# Patient Record
Sex: Female | Born: 1985 | Race: Asian | Hispanic: No | Marital: Single | Smoking: Current every day smoker
Health system: Southern US, Community
[De-identification: ages and names within clinical notes are randomized; demographics above are authoritative.]

## PROBLEM LIST (undated history)

## (undated) DIAGNOSIS — Z789 Other specified health status: Secondary | ICD-10-CM

## (undated) DIAGNOSIS — I10 Essential (primary) hypertension: Secondary | ICD-10-CM

## (undated) HISTORY — DX: Essential (primary) hypertension: I10

## (undated) HISTORY — DX: Other specified health status: Z78.9

## (undated) HISTORY — PX: DILATION AND CURETTAGE OF UTERUS: SHX78

---
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
## 2005-02-06 ENCOUNTER — Observation Stay: Payer: Self-pay | Admitting: Obstetrics and Gynecology

## 2012-01-04 DIAGNOSIS — F40298 Other specified phobia: Secondary | ICD-10-CM | POA: Insufficient documentation

## 2013-05-23 ENCOUNTER — Emergency Department: Payer: Self-pay | Admitting: Emergency Medicine

## 2013-05-23 LAB — URINALYSIS, COMPLETE
Bilirubin,UR: NEGATIVE
Glucose,UR: NEGATIVE mg/dL (ref 0–75)
Leukocyte Esterase: NEGATIVE
Protein: NEGATIVE
RBC,UR: NONE SEEN /HPF (ref 0–5)
Squamous Epithelial: 2

## 2013-05-23 LAB — CBC
MCV: 84 fL (ref 80–100)
RDW: 13.3 % (ref 11.5–14.5)
WBC: 13.1 10*3/uL — ABNORMAL HIGH (ref 3.6–11.0)

## 2013-06-21 ENCOUNTER — Ambulatory Visit: Payer: Self-pay | Admitting: Obstetrics and Gynecology

## 2013-06-21 LAB — CBC
HGB: 13 g/dL (ref 12.0–16.0)
MCHC: 33.3 g/dL (ref 32.0–36.0)
MCV: 85 fL (ref 80–100)
Platelet: 306 10*3/uL (ref 150–440)
RDW: 13.8 % (ref 11.5–14.5)
WBC: 13.1 10*3/uL — ABNORMAL HIGH (ref 3.6–11.0)

## 2014-11-21 NOTE — Op Note (Signed)
PATIENT NAMLenore Manner:  Brady, Sherri Brady MR#:  [id number] DATE OF BIRTH:  February 28, 1986  DATE OF PROCEDURE:  06/21/2013  PREOPERATIVE DIAGNOSIS: Missed spontaneous abortion.   POSTOPERATIVE DIAGNOSIS: Missed spontaneous abortion.   PROCEDURE: Suction dilation and curettage.   ANESTHESIA: General, LMA.   SURGEON: Thomasene MohairStephen Caldwell Kronenberger, MD   ESTIMATED BLOOD LOSS: 300 mL.  OPERATIVE FLUIDS: 1100 mL crystalloid.   COMPLICATIONS: None.   FINDINGS: 1.  A 9-week size uterus.  2.  Apparent products of conception.   SPECIMENS: Endometrial curettings.   CONDITION AT THE END OF PROCEDURE: Stable.   PROCEDURE IN DETAIL: The patient was taken to the operating room where general LMA anesthesia was administered and found to be adequate. She was placed in a dorsal supine high lithotomy position in the candy cane stirrups and prepped and draped in the usual sterile fashion. After a timeout was called, a red rubber catheter was introduced into the bladder for straight catheterization for return of about 100 mL of clear urine. A sterile speculum was placed in the vagina, and a single-tooth tenaculum was used to grasp the anterior lip of the cervix. The uterus was then sounded to a depth of approximately 9 cm. The uterus was then dilated gently in a serial fashion using Hegar dilators to approximately 9 mm. After testing the vacuum and verifying that it would achieve adequate suction, an 8 mm rigid suction curette was then used for 3 passes to remove the contents of the uterus. A regular curette was used for 2 passes to remove any remaining products, and a single final pass of the rigid suction curette was used to make sure there were no products of conception remaining. The patient was hemostatic at this point from the uterus. The single-tooth tenaculum was removed from the anterior lip of the cervix with hemostasis noted, and the sterile speculum was then removed from the vagina. There were no instruments left inside the  vagina at the end of the case.   The patient tolerated the procedure well. Sponge, lap, and needle counts were correct x 2. She received 100 mg of doxycycline IV on call to the OR. For VTE prophylaxis, she was wearing pneumatic compression stockings that were on and operating throughout the entire procedure. She was awakened in the operating room and transferred to the recovery area in stable condition.    ____________________________ Conard NovakStephen D. Lyah Millirons, MD sdj:jcm D: 06/21/2013 16:44:58 ET T: 06/21/2013 17:06:14 ET JOB#: [id number]  cc: Conard NovakStephen D. Saheed Carrington, MD, <Dictator> Conard NovakSTEPHEN D Presten Joost MD ELECTRONICALLY SIGNED 06/25/2013 18:37

## 2015-04-21 ENCOUNTER — Emergency Department
Admission: EM | Admit: 2015-04-21 | Discharge: 2015-04-21 | Disposition: A | Discharge status: Home / Self Care | Payer: Self-pay | Attending: Emergency Medicine | Admitting: Emergency Medicine

## 2015-04-21 ENCOUNTER — Emergency Department: Payer: Self-pay

## 2015-04-21 ENCOUNTER — Encounter: Payer: Self-pay | Admitting: Emergency Medicine

## 2015-04-21 DIAGNOSIS — O200 Threatened abortion: Secondary | ICD-10-CM | POA: Insufficient documentation

## 2015-04-21 DIAGNOSIS — F17210 Nicotine dependence, cigarettes, uncomplicated: Secondary | ICD-10-CM | POA: Insufficient documentation

## 2015-04-21 DIAGNOSIS — O99331 Smoking (tobacco) complicating pregnancy, first trimester: Secondary | ICD-10-CM | POA: Insufficient documentation

## 2015-04-21 DIAGNOSIS — R109 Unspecified abdominal pain: Secondary | ICD-10-CM

## 2015-04-21 DIAGNOSIS — Z79899 Other long term (current) drug therapy: Secondary | ICD-10-CM | POA: Insufficient documentation

## 2015-04-21 DIAGNOSIS — Z3A01 Less than 8 weeks gestation of pregnancy: Secondary | ICD-10-CM | POA: Insufficient documentation

## 2015-04-21 DIAGNOSIS — O26899 Other specified pregnancy related conditions, unspecified trimester: Secondary | ICD-10-CM

## 2015-04-21 LAB — CBC WITH DIFFERENTIAL/PLATELET
BASOS ABS: 0.1 10*3/uL (ref 0–0.1)
Basophils Relative: 1 %
EOS ABS: 0.3 10*3/uL (ref 0–0.7)
Eosinophils Relative: 3 %
HCT: 39.8 % (ref 35.0–47.0)
HEMOGLOBIN: 13.1 g/dL (ref 12.0–16.0)
LYMPHS ABS: 2.4 10*3/uL (ref 1.0–3.6)
LYMPHS PCT: 26 %
MCH: 28 pg (ref 26.0–34.0)
MCHC: 32.9 g/dL (ref 32.0–36.0)
MCV: 85 fL (ref 80.0–100.0)
Monocytes Absolute: 0.6 10*3/uL (ref 0.2–0.9)
Monocytes Relative: 7 %
NEUTROS PCT: 63 %
Neutro Abs: 5.8 10*3/uL (ref 1.4–6.5)
PLATELETS: 306 10*3/uL (ref 150–440)
RBC: 4.69 MIL/uL (ref 3.80–5.20)
RDW: 13.4 % (ref 11.5–14.5)
WBC: 9.3 10*3/uL (ref 3.6–11.0)

## 2015-04-21 LAB — URINALYSIS COMPLETE WITH MICROSCOPIC (ARMC ONLY)
Bilirubin Urine: NEGATIVE
GLUCOSE, UA: NEGATIVE mg/dL
Ketones, ur: NEGATIVE mg/dL
Leukocytes, UA: NEGATIVE
Nitrite: NEGATIVE
PROTEIN: NEGATIVE mg/dL
Specific Gravity, Urine: 1.009 (ref 1.005–1.030)
pH: 6 (ref 5.0–8.0)

## 2015-04-21 LAB — WET PREP, GENITAL
CLUE CELLS WET PREP: NONE SEEN
TRICH WET PREP: NONE SEEN
Yeast Wet Prep HPF POC: NONE SEEN

## 2015-04-21 LAB — POCT PREGNANCY, URINE: Preg Test, Ur: POSITIVE — AB

## 2015-04-21 LAB — CHLAMYDIA/NGC RT PCR (ARMC ONLY)
Chlamydia Tr: NOT DETECTED
N GONORRHOEAE: NOT DETECTED

## 2015-04-21 LAB — ABO/RH: ABO/RH(D): O POS

## 2015-04-21 LAB — HCG, QUANTITATIVE, PREGNANCY: hCG, Beta Chain, Quant, S: 2904 m[IU]/mL — ABNORMAL HIGH (ref <5)

## 2015-04-21 NOTE — ED Provider Notes (Addendum)
[facility] Emergency Department Provider Note  ____________________________________________  Time seen: Approximately 9 AM  I have reviewed the triage vital signs and the nursing notes.   HISTORY  Chief Complaint Abdominal Pain and Vaginal Bleeding    HPI Sherri Brady is a 29 y.o. female with a history of miscarriage and chlamydia who is presenting today with 2 weeks of abdominal cramping and one day of vaginal spotting. She denies any pain at this time. Says that the cramping is in the lower abdominal area and radiates to her back. No pain with urination. Is taking prenatal vitamins but has not had prenatal care yet this pregnancy. Is seen previously at [facility] side OB/GYN.   History reviewed. No pertinent past medical history.  There are no active problems to display for this patient.   History reviewed. No pertinent past surgical history.  Current Outpatient Rx  Name  Route  Sig  Dispense  Refill  . Prenatal Vit-Fe Fumarate-FA (PRENATAL MULTIVITAMIN) TABS tablet   Oral   Take 1 tablet by mouth every evening.           Allergies Review of patient's allergies indicates no known allergies.  History reviewed. No pertinent family history.  Social History Social History  Substance Use Topics  . Smoking status: Current Every Day Smoker  . Smokeless tobacco: None  . Alcohol Use: No    Review of Systems Constitutional: No fever/chills Eyes: No visual changes. ENT: No sore throat. Cardiovascular: Denies chest pain. Respiratory: Denies shortness of breath. Gastrointestinal:  No nausea, no vomiting.  No diarrhea.  No constipation. Genitourinary: Negative for dysuria. Musculoskeletal: As above Skin: Negative for rash. Neurological: Negative for headaches, focal weakness or numbness.  10-point ROS otherwise negative.  ____________________________________________   PHYSICAL EXAM:  VITAL SIGNS: ED Triage Vitals  Enc Vitals Group      BP 04/21/15 0754 122/60 mmHg     Pulse Rate 04/21/15 0754 68     Resp 04/21/15 0754 20     Temp 04/21/15 0754 97.6 F (36.4 C)     Temp Source 04/21/15 0754 Oral     SpO2 04/21/15 0754 96 %     Weight 04/21/15 0754 175 lb (79.379 kg)     Height 04/21/15 0754  (1.549 m)     Head Cir --      Peak Flow --      Pain Score 04/21/15 0755 5     Pain Loc --      Pain Edu? --      Excl. in GC? --     Constitutional: Alert and oriented. Well appearing and in no acute distress. Eyes: Conjunctivae are normal. PERRL. EOMI. Head: Atraumatic. Nose: No congestion/rhinnorhea. Mouth/Throat: Mucous membranes are moist.  Oropharynx non-erythematous. Neck: No stridor.   Cardiovascular: Normal rate, regular rhythm. Grossly normal heart sounds.  Good peripheral circulation. Respiratory: Normal respiratory effort.  No retractions. Lungs CTAB. Gastrointestinal: Soft and nontender. No distention. No abdominal bruits. No CVA tenderness. Genitourinary:  Normal external exam. Speculum exam with a small amount of maroon blood without any active bleeding. Bimanual exam with a closed cervix. There is no CMT, uterine or adnexal tenderness nor masses. Musculoskeletal: No lower extremity tenderness nor edema.  No joint effusions. Neurologic:  Normal speech and language. No gross focal neurologic deficits are appreciated. No gait instability. Skin:  Skin is warm, dry and intact. No rash noted. Psychiatric: Mood and affect are normal. Speech and behavior are normal.  ____________________________________________  LABS (all labs ordered are listed, but only abnormal results are displayed)  Labs Reviewed  WET PREP, GENITAL - Abnormal; Notable for the following:    WBC, Wet Prep HPF POC RARE (*)    All other components within normal limits  URINALYSIS COMPLETEWITH MICROSCOPIC ([facility] ONLY) - Abnormal; Notable for the following:    Color, Urine STRAW (*)    APPearance CLEAR (*)    Hgb urine dipstick 1+ (*)     Bacteria, UA RARE (*)    Squamous Epithelial / LPF 0-5 (*)    All other components within normal limits  HCG, QUANTITATIVE, PREGNANCY - Abnormal; Notable for the following:    hCG, Beta Chain, Quant, S 2904 (*)    All other components within normal limits  POCT PREGNANCY, URINE - Abnormal; Notable for the following:    Preg Test, Ur POSITIVE (*)    All other components within normal limits  CHLAMYDIA/NGC RT PCR ([facility] ONLY)  CBC WITH DIFFERENTIAL/PLATELET  ABO/RH   ____________________________________________  EKG   ____________________________________________  RADIOLOGY    Findings likely consistent with a failed pregnancy. Recommend follow-up in 10-14 days. ____________________________________________   PROCEDURES   ____________________________________________   INITIAL IMPRESSION / ASSESSMENT AND PLAN / ED COURSE  Pertinent labs & imaging results that were available during my care of the patient were reviewed by me and considered in my medical decision making (see chart for details).  ----------------------------------------- 1:09 PM on 04/21/2015 ----------------------------------------- Patient resting when this time with no abdominal pain. Discussed imaging as well as lab findings. Patient will follow-up in 2 days at [facility] side OB/GYN for repeat hormone levels and to schedule a repeat ultrasound. Will continue to take prenatal vitamins.  ____________________________________________   FINAL CLINICAL IMPRESSION(S) / ED DIAGNOSES  Final diagnoses:  Cramping affecting pregnancy, antepartum   Likely failed pregnancy.   Myrna Blazer, MD 04/21/15 1310  Myrna Blazer, MD 04/21/15 1311

## 2015-04-21 NOTE — ED Notes (Signed)
Pt to ed with c/o vaginal bleeding yesterday and abd cramping last week.  Pt states she knows she is pregnant but is unsure how many weeks.  Pt has had no prenatal care.  Pt reports lmp was in early July.

## 2015-04-21 NOTE — ED Notes (Addendum)
Upon entering pt's room to d/c, pt was no longer there. All belongings gone. MD and charge RN made aware. RN unable to review d/c instructions or obtain signature.

## 2015-04-21 NOTE — ED Notes (Signed)
Lab called regarding ABO/Rh factor, will call back momentarily.

## 2015-09-11 ENCOUNTER — Other Ambulatory Visit: Payer: Self-pay | Admitting: Advanced Practice Midwife

## 2015-09-11 DIAGNOSIS — Z3481 Encounter for supervision of other normal pregnancy, first trimester: Secondary | ICD-10-CM

## 2015-09-11 LAB — HM HIV SCREENING LAB: HM HIV Screening: NEGATIVE

## 2015-09-14 ENCOUNTER — Ambulatory Visit
Admission: RE | Admit: 2015-09-14 | Discharge: 2015-09-14 | Disposition: A | Discharge status: Home / Self Care | Payer: BLUE CROSS/BLUE SHIELD | Source: Ambulatory Visit | Attending: Advanced Practice Midwife | Admitting: Advanced Practice Midwife

## 2015-09-14 ENCOUNTER — Other Ambulatory Visit: Payer: Self-pay | Admitting: Advanced Practice Midwife

## 2015-09-14 DIAGNOSIS — Z3481 Encounter for supervision of other normal pregnancy, first trimester: Secondary | ICD-10-CM

## 2015-09-14 DIAGNOSIS — Z3687 Encounter for antenatal screening for uncertain dates: Secondary | ICD-10-CM

## 2015-09-14 DIAGNOSIS — O3680X0 Pregnancy with inconclusive fetal viability, not applicable or unspecified: Secondary | ICD-10-CM

## 2015-09-14 DIAGNOSIS — Z36 Encounter for antenatal screening of mother: Secondary | ICD-10-CM | POA: Diagnosis not present

## 2015-09-15 ENCOUNTER — Other Ambulatory Visit: Payer: Self-pay | Admitting: Advanced Practice Midwife

## 2015-09-15 DIAGNOSIS — O26851 Spotting complicating pregnancy, first trimester: Secondary | ICD-10-CM

## 2015-09-24 ENCOUNTER — Ambulatory Visit: Payer: Medicaid Other

## 2015-09-25 ENCOUNTER — Ambulatory Visit: Payer: BLUE CROSS/BLUE SHIELD

## 2015-10-01 ENCOUNTER — Encounter: Payer: Self-pay | Admitting: *Deleted

## 2015-10-01 ENCOUNTER — Emergency Department
Admission: EM | Admit: 2015-10-01 | Discharge: 2015-10-01 | Disposition: A | Discharge status: Home / Self Care | Payer: BLUE CROSS/BLUE SHIELD | Attending: Emergency Medicine | Admitting: Emergency Medicine

## 2015-10-01 ENCOUNTER — Emergency Department: Payer: BLUE CROSS/BLUE SHIELD

## 2015-10-01 DIAGNOSIS — Z79899 Other long term (current) drug therapy: Secondary | ICD-10-CM | POA: Insufficient documentation

## 2015-10-01 DIAGNOSIS — F17200 Nicotine dependence, unspecified, uncomplicated: Secondary | ICD-10-CM | POA: Insufficient documentation

## 2015-10-01 DIAGNOSIS — O034 Incomplete spontaneous abortion without complication: Secondary | ICD-10-CM | POA: Diagnosis not present

## 2015-10-01 DIAGNOSIS — O99331 Smoking (tobacco) complicating pregnancy, first trimester: Secondary | ICD-10-CM | POA: Insufficient documentation

## 2015-10-01 DIAGNOSIS — O209 Hemorrhage in early pregnancy, unspecified: Secondary | ICD-10-CM | POA: Diagnosis present

## 2015-10-01 LAB — CBC WITH DIFFERENTIAL/PLATELET
BASOS ABS: 0.1 10*3/uL (ref 0–0.1)
BASOS PCT: 1 %
EOS PCT: 4 %
Eosinophils Absolute: 0.4 10*3/uL (ref 0–0.7)
HCT: 35.3 % (ref 35.0–47.0)
Hemoglobin: 11.9 g/dL — ABNORMAL LOW (ref 12.0–16.0)
Lymphocytes Relative: 27 %
Lymphs Abs: 3.1 10*3/uL (ref 1.0–3.6)
MCH: 28.2 pg (ref 26.0–34.0)
MCHC: 33.6 g/dL (ref 32.0–36.0)
MCV: 83.8 fL (ref 80.0–100.0)
MONO ABS: 0.8 10*3/uL (ref 0.2–0.9)
MONOS PCT: 7 %
Neutro Abs: 6.9 10*3/uL — ABNORMAL HIGH (ref 1.4–6.5)
Neutrophils Relative %: 61 %
PLATELETS: 254 10*3/uL (ref 150–440)
RBC: 4.21 MIL/uL (ref 3.80–5.20)
RDW: 13.6 % (ref 11.5–14.5)
WBC: 11.3 10*3/uL — ABNORMAL HIGH (ref 3.6–11.0)

## 2015-10-01 LAB — HCG, QUANTITATIVE, PREGNANCY: HCG, BETA CHAIN, QUANT, S: 1018 m[IU]/mL — AB (ref <5)

## 2015-10-01 MED ORDER — MISOPROSTOL 200 MCG PO TABS: 800.0000 ug | ORAL_TABLET | Freq: Once | ORAL | Status: DC

## 2015-10-01 MED ORDER — MISOPROSTOL 200 MCG PO TABS
800.0000 ug | ORAL_TABLET | ORAL | Status: AC
  Administered 2015-10-01: 800 ug via ORAL
  Filled 2015-10-01: qty 4

## 2015-10-01 NOTE — ED Provider Notes (Signed)
[facility] Emergency Department Provider Note  ____________________________________________  Time seen: 8:30 AM  I have reviewed the triage vital signs and the nursing notes.   HISTORY  Chief Complaint Vaginal Bleeding    HPI Sherri Brady is a 30 y.o. female who complains of heavy vaginal bleeding for the past 12 hours. States that she had an ultrasound about 2 weeks ago that showed that she was about to have a miscarriage, and then over the last week she started having some light vaginal bleeding. Yesterday she started having abdominal cramping and heavier bleeding going through 1 pad per hour. Denies chest pain shortness of breath fever chills nausea vomiting or dizziness or syncope.  Thinks she may have passed some tissue   History reviewed. No pertinent past medical history.   There are no active problems to display for this patient.    History reviewed. No pertinent past surgical history. History of D&C due to missed abortion  Current Outpatient Rx  Name  Route  Sig  Dispense  Refill  . misoprostol (CYTOTEC) 200 MCG tablet   Oral   Take 4 tablets (800 mcg total) by mouth once. DO NOT SWALLOW. Hold 2 tablets in each cheek until they dissolve.   4 tablet   0   . Prenatal Vit-Fe Fumarate-FA (PRENATAL MULTIVITAMIN) TABS tablet   Oral   Take 1 tablet by mouth every evening.            Allergies Review of patient's allergies indicates no known allergies.   History reviewed. No pertinent family history.  Social History Social History  Substance Use Topics  . Smoking status: Current Every Day Smoker  . Smokeless tobacco: None  . Alcohol Use: No    Review of Systems  Constitutional:   No fever or chills. No weight changes Eyes:   No blurry vision or double vision.  ENT:   No sore throat.  Cardiovascular:   No chest pain. Respiratory:   No dyspnea or cough. Gastrointestinal:   Negative for abdominal pain, vomiting and  diarrhea.  No BRBPR or melena. Genitourinary:   Positive vaginal bleeding. Musculoskeletal:   Negative for back pain. No joint swelling or pain. Skin:   Negative for rash. Neurological:   Negative for headaches, focal weakness or numbness. Psychiatric:  No anxiety or depression.   Endocrine:  No changes in energy or sleep difficulty.  10-point ROS otherwise negative.  ____________________________________________   PHYSICAL EXAM:  VITAL SIGNS: ED Triage Vitals  Enc Vitals Group     BP 10/01/15 0829 118/61 mmHg     Pulse Rate 10/01/15 0829 70     Resp 10/01/15 0829 18     Temp 10/01/15 0829 97.7 F (36.5 C)     Temp Source 10/01/15 0829 Oral     SpO2 10/01/15 0829 99 %     Weight 10/01/15 0829 175 lb (79.379 kg)     Height 10/01/15 0829  (1.549 m)     Head Cir --      Peak Flow --      Pain Score 10/01/15 0859 7     Pain Loc --      Pain Edu? --      Excl. in GC? --     Vital signs reviewed, nursing assessments reviewed.   Constitutional:   Alert and oriented. Well appearing and in no distress. Eyes:   No scleral icterus. No conjunctival pallor. PERRL. EOMI ENT   Head:   Normocephalic and  atraumatic.   Nose:   No congestion/rhinnorhea. No septal hematoma   Mouth/Throat:   MMM, no pharyngeal erythema. No peritonsillar mass.    Neck:   No stridor. No SubQ emphysema. No meningismus. Hematological/Lymphatic/Immunilogical:   No cervical lymphadenopathy. Cardiovascular:   RRR. Symmetric bilateral radial and DP pulses.  No murmurs.  Respiratory:   Normal respiratory effort without tachypnea nor retractions. Breath sounds are clear and equal bilaterally. No wheezes/rales/rhonchi. Gastrointestinal:   Soft and nontender. Non distended. There is no CVA tenderness.  No rebound, rigidity, or guarding. Genitourinary:   deferred Musculoskeletal:   Nontender with normal range of motion in all extremities. No joint effusions.  No lower extremity tenderness.  No  edema. Neurologic:   Normal speech and language.  CN 2-10 normal. Motor grossly intact. No gross focal neurologic deficits are appreciated.  Skin:    Skin is warm, dry and intact. No rash noted.  No petechiae, purpura, or bullae. Psychiatric:   Mood and affect are normal. ____________________________________________    LABS (pertinent positives/negatives) (all labs ordered are listed, but only abnormal results are displayed) Labs Reviewed  HCG, QUANTITATIVE, PREGNANCY - Abnormal; Notable for the following:    hCG, Beta Chain, Quant, S 1018 (*)    All other components within normal limits  CBC WITH DIFFERENTIAL/PLATELET - Abnormal; Notable for the following:    WBC 11.3 (*)    Hemoglobin 11.9 (*)    Neutro Abs 6.9 (*)    All other components within normal limits   ____________________________________________   EKG    ____________________________________________    RADIOLOGY  Ultrasound of the pelvis shows an intrauterine pregnancy, nonviable without any fetal heart tones. Six-week size gestational sac with fetal pole.  ____________________________________________   PROCEDURES   ____________________________________________   INITIAL IMPRESSION / ASSESSMENT AND PLAN / ED COURSE  Pertinent labs & imaging results that were available during my care of the patient were reviewed by me and considered in my medical decision making (see chart for details).  Patient presents with vaginal bleeding, concern for missed abortion. We'll repeat ultrasound to assess for retained POC's.  ----------------------------------------- 1:52 PM on 10/01/2015 ----------------------------------------- Ultrasound again reveals an intrauterine gestational sac with fetal pole. Case discussed with on-call gynecology Dr. Elesa Massed who suggests that this can be managed with Cytotec if the bleeding is improving or within the range of a heavy period, or possibly D&C if it's more than that. I rediscussed  with the patient states that the blood bleeding is continuing without improvement, at least one pad soaked per hour. I advised them that we should have Dr. Elesa Massed evaluate her for a D&C, but the patient refuses. She states that she would like to go home and follow-up. She requests oral medications that may go to help with her symptoms. We'll therefore give her Cytotec buccally here in the ED and a prescription for a repeat dose tomorrow. She understands that if her bleeding is not improving in the next 48 hours, she'll need to come back to the ED to be evaluated for a D&C. If symptoms improve she'll follow-up with gynecology on Monday. No evidence of any acute or symptomatic anemia. No evidence of PE or infection at this time.    ____________________________________________   FINAL CLINICAL IMPRESSION(S) / ED DIAGNOSES  Final diagnoses:  Incomplete miscarriage      Sharman Cheek, MD 10/01/15 1354

## 2015-10-01 NOTE — ED Notes (Signed)
Pt states she had a miscarriage at 6 weeks, 1 week ago, states her bleeding worsened yesterday and is using more then 1 pad/hr with abd cramping.

## 2015-10-01 NOTE — Discharge Instructions (Signed)
Your ultrasound today shows again that you have a retained but nonviable pregnancy, similar to 2 weeks ago.. We gave you a dose of misoprostol today in the emergency department to help facilitate the process of your body completing the miscarriage. Please fill the prescription and take the second dose of this medication tomorrow at about the same time, 2 PM. If your bleeding is not improved by Saturday, return to the emergency room for evaluation as you will likely need a procedure to clear the contents of your uterus. If your symptoms improve, follow up with gynecology on Monday.  Incomplete Miscarriage A miscarriage is the sudden loss of an unborn baby (fetus) before the 20th week of pregnancy. In an incomplete miscarriage, parts of the fetus or placenta (afterbirth) remain in the body.  Having a miscarriage can be an emotional experience. Talk with your health care provider about any questions you may have about miscarrying, the grieving process, and your future pregnancy plans. CAUSES   Problems with the fetal chromosomes that make it impossible for the baby to develop normally. Problems with the baby's genes or chromosomes are most often the result of errors that occur by chance as the embryo divides and grows. The problems are not inherited from the parents.  Infection of the cervix or uterus.  Hormone problems.  Problems with the cervix, such as having an incompetent cervix. This is when the tissue in the cervix is not strong enough to hold the pregnancy.  Problems with the uterus, such as an abnormally shaped uterus, uterine fibroids, or congenital abnormalities.  Certain medical conditions.  Smoking, drinking alcohol, or taking illegal drugs.  Trauma. SYMPTOMS   Vaginal bleeding or spotting, with or without cramps or pain.  Pain or cramping in the abdomen or lower back.  Passing fluid, tissue, or blood clots from the vagina. DIAGNOSIS  Your health care provider will perform a  physical exam. You may also have an ultrasound to confirm the miscarriage. Blood or urine tests may also be ordered. TREATMENT   Usually, a dilation and curettage (D&C) procedure is performed. During a D&C procedure, the cervix is widened (dilated) and any remaining fetal or placental tissue is gently removed from the uterus.  Antibiotic medicines are prescribed if there is an infection. Other medicines may be given to reduce the size of the uterus (contract) if there is a lot of bleeding.  If you have Rh negative blood and your baby was Rh positive, you will need a Rho (D) immune globulin shot. This shot will protect any future baby from having Rh blood problems in future pregnancies.  You may be confined to bed rest. This means you should stay in bed and only get up to use the bathroom. HOME CARE INSTRUCTIONS   Rest as directed by your health care provider.  Restrict activity as directed by your health care provider. You may be allowed to continue light activity if curettage was not done but you require further treatment.  Keep track of the number of pads you use each day. Keep track of how soaked (saturated) they are. Record this information.  Do not  use tampons.  Do not douche or have sexual intercourse until approved by your health care provider.  Keep all follow-up appointments for reevaluation and continuing management.  Only take over-the-counter or prescription medicines for pain, fever, or discomfort as directed by your health care provider.  Take antibiotic medicine as directed by your health care provider. Make sure you finish it  even if you start to feel better. SEEK IMMEDIATE MEDICAL CARE IF:   You experience severe cramps in your stomach, back, or abdomen.  You have an unexplained temperature (make sure to record these temperatures).  You pass large clots or tissue (save these for your health care provider to inspect).  Your bleeding increases.  You become  light-headed, weak, or have fainting episodes. MAKE SURE YOU:   Understand these instructions.  Will watch your condition.  Will get help right away if you are not doing well or get worse.   This information is not intended to replace advice given to you by your health care provider. Make sure you discuss any questions you have with your health care provider.   Document Released: 07/18/2005 Document Revised: 08/08/2014 Document Reviewed: 02/14/2013 Elsevier Interactive Patient Education [employer]

## 2015-10-01 NOTE — ED Notes (Signed)
States she had a miscarriage last week and now continues to have vaginal bleeding, states bleeding increased last night, states she is scheduled for an [place] follow-up tomorrow, states lower abd and back cramping

## 2015-10-02 ENCOUNTER — Ambulatory Visit: Admission: RE | Admit: 2015-10-02 | Payer: BLUE CROSS/BLUE SHIELD | Source: Ambulatory Visit

## 2016-03-25 LAB — OB RESULTS CONSOLE RUBELLA ANTIBODY, IGM: RUBELLA: NON-IMMUNE/NOT IMMUNE

## 2016-03-25 LAB — OB RESULTS CONSOLE HEPATITIS B SURFACE ANTIGEN: Hepatitis B Surface Ag: NEGATIVE

## 2016-03-25 LAB — OB RESULTS CONSOLE ABO/RH: RH Type: POSITIVE

## 2016-03-25 LAB — OB RESULTS CONSOLE HIV ANTIBODY (ROUTINE TESTING): HIV: NONREACTIVE

## 2016-03-25 LAB — OB RESULTS CONSOLE RPR: RPR: NONREACTIVE

## 2016-03-25 LAB — OB RESULTS CONSOLE VARICELLA ZOSTER ANTIBODY, IGG: VARICELLA IGG: IMMUNE

## 2016-03-25 LAB — OB RESULTS CONSOLE GC/CHLAMYDIA
CHLAMYDIA, DNA PROBE: NEGATIVE
GC PROBE AMP, GENITAL: NEGATIVE

## 2016-03-25 LAB — OB RESULTS CONSOLE ANTIBODY SCREEN: Antibody Screen: NEGATIVE

## 2016-04-09 ENCOUNTER — Emergency Department
Admission: EM | Admit: 2016-04-09 | Discharge: 2016-04-09 | Disposition: A | Discharge status: Home / Self Care | Payer: BLUE CROSS/BLUE SHIELD | Attending: Emergency Medicine | Admitting: Emergency Medicine

## 2016-04-09 ENCOUNTER — Encounter: Payer: Self-pay | Admitting: Emergency Medicine

## 2016-04-09 ENCOUNTER — Emergency Department: Payer: BLUE CROSS/BLUE SHIELD

## 2016-04-09 DIAGNOSIS — O200 Threatened abortion: Secondary | ICD-10-CM | POA: Diagnosis not present

## 2016-04-09 DIAGNOSIS — R102 Pelvic and perineal pain: Secondary | ICD-10-CM | POA: Diagnosis not present

## 2016-04-09 DIAGNOSIS — O418X10 Other specified disorders of amniotic fluid and membranes, first trimester, not applicable or unspecified: Secondary | ICD-10-CM

## 2016-04-09 DIAGNOSIS — Z3A11 11 weeks gestation of pregnancy: Secondary | ICD-10-CM | POA: Insufficient documentation

## 2016-04-09 DIAGNOSIS — F17200 Nicotine dependence, unspecified, uncomplicated: Secondary | ICD-10-CM | POA: Insufficient documentation

## 2016-04-09 DIAGNOSIS — O99331 Smoking (tobacco) complicating pregnancy, first trimester: Secondary | ICD-10-CM | POA: Insufficient documentation

## 2016-04-09 DIAGNOSIS — O209 Hemorrhage in early pregnancy, unspecified: Secondary | ICD-10-CM | POA: Diagnosis present

## 2016-04-09 DIAGNOSIS — O468X1 Other antepartum hemorrhage, first trimester: Secondary | ICD-10-CM

## 2016-04-09 LAB — CBC WITH DIFFERENTIAL/PLATELET
BASOS ABS: 0.1 10*3/uL (ref 0–0.1)
BASOS PCT: 1 %
EOS PCT: 3 %
Eosinophils Absolute: 0.4 10*3/uL (ref 0–0.7)
HCT: 36.2 % (ref 35.0–47.0)
Hemoglobin: 12.6 g/dL (ref 12.0–16.0)
LYMPHS PCT: 24 %
Lymphs Abs: 3.7 10*3/uL — ABNORMAL HIGH (ref 1.0–3.6)
MCH: 28.4 pg (ref 26.0–34.0)
MCHC: 34.7 g/dL (ref 32.0–36.0)
MCV: 81.9 fL (ref 80.0–100.0)
Monocytes Absolute: 0.9 10*3/uL (ref 0.2–0.9)
Monocytes Relative: 6 %
Neutro Abs: 10.2 10*3/uL — ABNORMAL HIGH (ref 1.4–6.5)
Neutrophils Relative %: 66 %
PLATELETS: 295 10*3/uL (ref 150–440)
RBC: 4.42 MIL/uL (ref 3.80–5.20)
RDW: 14 % (ref 11.5–14.5)
WBC: 15.3 10*3/uL — AB (ref 3.6–11.0)

## 2016-04-09 LAB — URINALYSIS COMPLETE WITH MICROSCOPIC (ARMC ONLY)
BILIRUBIN URINE: NEGATIVE
GLUCOSE, UA: NEGATIVE mg/dL
KETONES UR: NEGATIVE mg/dL
LEUKOCYTES UA: NEGATIVE
NITRITE: NEGATIVE
PH: 7 (ref 5.0–8.0)
Protein, ur: NEGATIVE mg/dL
SPECIFIC GRAVITY, URINE: 1.01 (ref 1.005–1.030)

## 2016-04-09 LAB — HCG, QUANTITATIVE, PREGNANCY: HCG, BETA CHAIN, QUANT, S: 159741 m[IU]/mL — AB (ref <5)

## 2016-04-09 LAB — ABO/RH: ABO/RH(D): O POS

## 2016-04-09 MED ORDER — ONDANSETRON 4 MG PO TBDP: 4.0000 mg | ORAL_TABLET | Freq: Three times a day (TID) | ORAL | 0 refills | Status: DC | PRN

## 2016-04-09 NOTE — ED Notes (Signed)
Pt back from US

## 2016-04-09 NOTE — Discharge Instructions (Signed)
1. Avoid tampons, douche or sexual intercourse until seen by your OB/GYN. 2. Return to the ER for worsening symptoms, soaking more than one pad per hour, fainting or other concerns.

## 2016-04-09 NOTE — ED Notes (Signed)
Patient transported to Ultrasound 

## 2016-04-09 NOTE — ED Notes (Signed)
Pt able to drink water now to get urine sample.

## 2016-04-09 NOTE — ED Triage Notes (Signed)
Pt states is [redacted] weeks pregnant and awoke this am with vaginal bleeding and back pain. Pt states has used one pad. Skin normal color warm and dry.

## 2016-04-09 NOTE — ED Provider Notes (Signed)
[facility] Emergency Department Provider Note   ____________________________________________   First MD Initiated Contact with Patient 04/09/16 [phone]     (approximate)  I have reviewed the triage vital signs and the nursing notes.   HISTORY  Chief Complaint Vaginal Bleeding    HPI Sherri Brady is a 30 y.o. female who presents to the ED from home with a chief complaint of vaginal bleeding. Patient is a [id number] approximately [redacted] weeks pregnant who awoke this morning with vaginal bleeding and low back pain. Reports she has used one pad prior to arrival.Last sexual intercourse 3 days ago. Denies associated fever, chills, chest pain, shortness of breath, vomiting, diarrhea. Denies recent travel or trauma. Nothing makes her symptoms better or worse.   Past medical history None  There are no active problems to display for this patient.   No past surgical history on file.  Prior to Admission medications   Medication Sig Start Date End Date Taking? Authorizing Provider  misoprostol (CYTOTEC) 200 MCG tablet Take 4 tablets (800 mcg total) by mouth once. DO NOT SWALLOW. Hold 2 tablets in each cheek until they dissolve. 10/01/15   Sharman Cheek, MD  ondansetron (ZOFRAN ODT) 4 MG disintegrating tablet Take 1 tablet (4 mg total) by mouth every 8 (eight) hours as needed for nausea or vomiting. 04/09/16   Irean Hong, MD  Prenatal Vit-Fe Fumarate-FA (PRENATAL MULTIVITAMIN) TABS tablet Take 1 tablet by mouth every evening.    Historical Provider, MD    Allergies Review of patient's allergies indicates no known allergies.  No family history on file.  Social History Social History  Substance Use Topics  . Smoking status: Current Every Day Smoker  . Smokeless tobacco: Not on file  . Alcohol use No    Review of Systems  Constitutional: No fever/chills. Eyes: No visual changes. ENT: No sore throat. Cardiovascular: Denies chest pain. Respiratory: Denies  shortness of breath. Gastrointestinal: No abdominal pain.  No nausea, no vomiting.  No diarrhea.  No constipation. Genitourinary: Positive for vaginal bleeding. Negative for dysuria. Musculoskeletal: Negative for back pain. Skin: Negative for rash. Neurological: Negative for headaches, focal weakness or numbness.  10-point ROS otherwise negative.  ____________________________________________   PHYSICAL EXAM:  VITAL SIGNS: ED Triage Vitals  Enc Vitals Group     BP 04/09/16 0406 129/70     Pulse Rate 04/09/16 0406 76     Resp 04/09/16 0406 16     Temp 04/09/16 0406 97.7 F (36.5 C)     Temp Source 04/09/16 0406 Oral     SpO2 04/09/16 0406 100 %     Weight 04/09/16 0407 175 lb (79.4 kg)     Height 04/09/16 0407 5\' 2"  (1.575 m)     Head Circumference --      Peak Flow --      Pain Score 04/09/16 0407 4     Pain Loc --      Pain Edu? --      Excl. in GC? --     Constitutional: Alert and oriented. Well appearing and in no acute distress. Eyes: Conjunctivae are normal. PERRL. EOMI. Head: Atraumatic. Nose: No congestion/rhinnorhea. Mouth/Throat: Mucous membranes are moist.  Oropharynx non-erythematous. Neck: No stridor.   Cardiovascular: Normal rate, regular rhythm. Grossly normal heart sounds.  Good peripheral circulation. Respiratory: Normal respiratory effort.  No retractions. Lungs CTAB. Gastrointestinal: Soft and minimally tender to palpation pelvis without rebound or guarding. No distention. No abdominal bruits. No CVA tenderness. Musculoskeletal: No  lower extremity tenderness nor edema.  No joint effusions. Neurologic:  Normal speech and language. No gross focal neurologic deficits are appreciated. No gait instability. Skin:  Skin is warm, dry and intact. No rash noted. Psychiatric: Mood and affect are normal. Speech and behavior are normal.  ____________________________________________   LABS (all labs ordered are listed, but only abnormal results are  displayed)  Labs Reviewed  HCG, QUANTITATIVE, PREGNANCY - Abnormal; Notable for the following:       Result Value   hCG, Beta Chain, Quant, S [id number] (*)    All other components within normal limits  CBC WITH DIFFERENTIAL/PLATELET - Abnormal; Notable for the following:    WBC 15.3 (*)    Neutro Abs 10.2 (*)    Lymphs Abs 3.7 (*)    All other components within normal limits  URINALYSIS COMPLETEWITH MICROSCOPIC ([facility] ONLY)  ABO/RH   ____________________________________________  EKG  None ____________________________________________  RADIOLOGY  OB ultrasound interpreted per Dr. Cherly Hensenhang: 1. Single live intrauterine pregnancy noted, with a crown-rump  length of 4.7 cm, corresponding to a gestational age of [redacted] weeks 3  days. This matches the gestational age of [redacted] weeks 1 day by LMP,  reflecting an estimated date of delivery of October 28, 2016.  2. Small to moderate amount of subchorionic hemorrhage noted.   ____________________________________________   PROCEDURES  Procedure(s) performed:   Pelvic exam: External exam WNL without rashes, lesions or vesicles. Speculum exam reveals mild vaginal bleeding, no clots. Cervical os closed. Bimanual exam WNL.  Procedures  Critical Care performed: No  ____________________________________________   INITIAL IMPRESSION / ASSESSMENT AND PLAN / ED COURSE  Pertinent labs & imaging results that were available during my care of the patient were reviewed by me and considered in my medical decision making (see chart for details).  30 year old female who presents with first trimester vaginal bleeding. H/H is stable; will proceed with OB ultrasound.  Clinical Course  Comment By Time  Patient sleeping in no acute distress. Updated her of ultrasound imaging results. Awaiting urinalysis. Anticipate discharge home to follow-up with OB/GYN. Strict return precautions given. Patient verbalizes understanding and agrees with plan of care. Irean HongJade J  Sung, MD 09/09 [phone]     ____________________________________________   FINAL CLINICAL IMPRESSION(S) / ED DIAGNOSES  Final diagnoses:  Threatened miscarriage  Subchorionic bleed, first trimester      NEW MEDICATIONS STARTED DURING THIS VISIT:  New Prescriptions   ONDANSETRON (ZOFRAN ODT) 4 MG DISINTEGRATING TABLET    Take 1 tablet (4 mg total) by mouth every 8 (eight) hours as needed for nausea or vomiting.     Note:  This document was prepared using Dragon voice recognition software and may include unintentional dictation errors.    Irean HongJade J Sung, MD 04/09/16 [phone]

## 2016-05-11 ENCOUNTER — Encounter: Payer: BLUE CROSS/BLUE SHIELD | Admitting: Family Medicine

## 2016-05-25 ENCOUNTER — Encounter: Payer: Self-pay | Admitting: *Deleted

## 2016-05-25 LAB — GLUCOSE, 1 HOUR GESTATIONAL: Glucose 1 Hr Prenatal, POC: 140 mg/dL

## 2016-05-26 ENCOUNTER — Encounter: Payer: BLUE CROSS/BLUE SHIELD | Admitting: Obstetrics and Gynecology

## 2016-06-02 ENCOUNTER — Encounter: Payer: Self-pay | Admitting: Obstetrics and Gynecology

## 2016-06-02 ENCOUNTER — Ambulatory Visit (INDEPENDENT_AMBULATORY_CARE_PROVIDER_SITE_OTHER): Payer: Medicaid Other | Admitting: Obstetrics and Gynecology

## 2016-06-02 ENCOUNTER — Encounter: Payer: Self-pay | Admitting: *Deleted

## 2016-06-02 VITALS — BP 114/78 | HR 101 | Wt 190.0 lb

## 2016-06-02 DIAGNOSIS — O99210 Obesity complicating pregnancy, unspecified trimester: Secondary | ICD-10-CM

## 2016-06-02 DIAGNOSIS — O99212 Obesity complicating pregnancy, second trimester: Secondary | ICD-10-CM | POA: Diagnosis not present

## 2016-06-02 DIAGNOSIS — Z3490 Encounter for supervision of normal pregnancy, unspecified, unspecified trimester: Secondary | ICD-10-CM | POA: Insufficient documentation

## 2016-06-02 DIAGNOSIS — E669 Obesity, unspecified: Secondary | ICD-10-CM | POA: Diagnosis not present

## 2016-06-02 DIAGNOSIS — R7309 Other abnormal glucose: Secondary | ICD-10-CM | POA: Diagnosis not present

## 2016-06-02 DIAGNOSIS — Z6833 Body mass index (BMI) 33.0-33.9, adult: Secondary | ICD-10-CM

## 2016-06-02 MED ORDER — ASPIRIN EC 81 MG PO TBEC: 81.0000 mg | DELAYED_RELEASE_TABLET | Freq: Every day | ORAL | 3 refills | Status: DC

## 2016-06-02 NOTE — Progress Notes (Signed)
New OB Note  06/02/2016   [facility]  Chief Complaint: NOB  Transfer of Care Patient: Yes, [facility]  History of Present Illness: Ms. Sherri Brady is a 30 y.o. [id number] @ 18/6 weeks (EDC 3/30, based on Patient's last menstrual period was 01/22/2016.=11wk u/s), with the above CC. Preg complicated by has Supervision of normal pregnancy, antepartum; Abnormal glucose; and Obesity in pregnancy on her problem list.   She was using no method when she conceived.  She has Negative signs or symptoms of nausea/vomiting of pregnancy. She has Negative signs or symptoms of miscarriage or preterm labor  Patient desires to switch to us b/c we are closer.   ROS: A 12-point review of systems was performed and negative, except as stated in the above HPI.  OBGYN History: As per HPI. OB History  Gravida Para Term Preterm AB Living  6 2 2  0 3 2  SAB TAB Ectopic Multiple Live Births  3 0 0 0 2    # Outcome Date GA Lbr Len/2nd Weight Sex Delivery Anes PTL Lv  6 Current           5 Term      Vag-Spont   LIV  4 Term      Vag-Spont   LIV  3 SAB           2 SAB           1 SAB             Obstetric Comments  G1: 03/2004, TSVD 8lbs, no issues; G2: 03/2005 TSVD, 8lbs; G3 07/2013 SAB D&C; G4 2015 SAB; G5 2017 SAB.  All SABs were early.     Any issues with any prior pregnancies: no Any prior children are healthy, doing well, without any problems or issues: yes History of pap smears: Yes. Last pap smear 2017. Abnormal: no  History of STIs: Yes, in the remote past   Past Medical History: History reviewed. No pertinent past medical history.  Past Surgical History: Past Surgical History:  Procedure Laterality Date  . DILATION AND CURETTAGE OF UTERUS      Family History:  No family history on file.  She denies any history of mental retardation, birth defects or genetic disorders in her or the FOB's history  Social History:  Social History   Social  History  . Marital status: Single    Spouse name: N/A  . Number of children: N/A  . Years of education: N/A   Occupational History  . Not on file.   Social History Main Topics  . Smoking status: Current Every Day Smoker  . Smokeless tobacco: Never Used  . Alcohol use No  . Drug use: No  . Sexual activity: Yes    Birth control/ protection: None   Other Topics Concern  . Not on file   Social History Narrative  . No narrative on file    Allergy: No Known Allergies  Current Outpatient Medications: PNV  Physical Exam:   BP 114/78   Pulse (!) 101   Wt 190 lb (86.2 kg)   LMP 01/22/2016   BMI 34.75 kg/m  Body mass index is 34.75 kg/m. Fundal height: not applicable FHTs: 140s  General appearance: Well nourished, well developed female in no acute distress.   Laboratory: O pos/RNI/VI/rpr neg/hiv neg/hepB neg/GC-CT neg/UCx neg/hemoglobinopathy neg/1hr GCT 140/NT scan and analytes negative  Imaging:  As above. [facility] imaging c/w [facility] u/s  Assessment: pt doing well  Plan:  1. Encounter for supervision of normal pregnancy, antepartum, unspecified gravidity Routine PNC. Scheduled for routine MFM anatomy scan AFP, CMP and PC ratio with 3hr - US MFM OB COMP + 14 WK; Future  2. Abnormal glucose Pt will come back next week for 3hr. Pt aware she needs to be fasting and aware that if she passes will need repeat 3hr at 24-28wks  3. BMI 33.0-33.9,adult Pt told to start baby asa given obesity and slight glucose intolerance already  4. Obesity in pregnancy See above.   Problem list reviewed and updated.  Follow up in 1 week for labs and 4wks for ROB  >50% of 20 min visit spent on counseling and coordination of care.     Cornelia Copaharlie Darwyn Ponzo, Jr. MD Attending [facility](Faculty Practice)

## 2016-06-09 ENCOUNTER — Other Ambulatory Visit (INDEPENDENT_AMBULATORY_CARE_PROVIDER_SITE_OTHER): Payer: BLUE CROSS/BLUE SHIELD | Admitting: *Deleted

## 2016-06-09 ENCOUNTER — Ambulatory Visit (HOSPITAL_COMMUNITY)
Admission: RE | Admit: 2016-06-09 | Discharge: 2016-06-09 | Disposition: A | Discharge status: Home / Self Care | Payer: BLUE CROSS/BLUE SHIELD | Source: Ambulatory Visit | Attending: Obstetrics and Gynecology | Admitting: Obstetrics and Gynecology

## 2016-06-09 ENCOUNTER — Other Ambulatory Visit: Payer: Self-pay | Admitting: Obstetrics and Gynecology

## 2016-06-09 DIAGNOSIS — Z3492 Encounter for supervision of normal pregnancy, unspecified, second trimester: Secondary | ICD-10-CM | POA: Diagnosis not present

## 2016-06-09 DIAGNOSIS — Z3A19 19 weeks gestation of pregnancy: Secondary | ICD-10-CM

## 2016-06-09 DIAGNOSIS — Z363 Encounter for antenatal screening for malformations: Secondary | ICD-10-CM

## 2016-06-09 DIAGNOSIS — Z3490 Encounter for supervision of normal pregnancy, unspecified, unspecified trimester: Secondary | ICD-10-CM

## 2016-06-09 DIAGNOSIS — O99212 Obesity complicating pregnancy, second trimester: Secondary | ICD-10-CM | POA: Diagnosis not present

## 2016-06-10 LAB — GLUCOSE TOLERANCE, 3 HOURS
GLUCOSE 3 HOUR GTT: 65 mg/dL (ref <145)
GLUCOSE, 2 HOUR-GESTATIONAL: 132 mg/dL (ref <165)
Glucose Tolerance, 1 hour: 180 mg/dL (ref <190)
Glucose Tolerance, Fasting: 75 mg/dL (ref 65–104)

## 2016-06-10 LAB — PROTEIN / CREATININE RATIO, URINE
CREATININE, URINE: 81 mg/dL (ref 20–320)
Protein Creatinine Ratio: 111 mg/g creat (ref 21–161)
Total Protein, Urine: 9 mg/dL (ref 5–24)

## 2016-06-11 LAB — COMPREHENSIVE METABOLIC PANEL
ALK PHOS: 59 U/L (ref 33–115)
ALT: 11 U/L (ref 6–29)
AST: 14 U/L (ref 10–30)
Albumin: 3.6 g/dL (ref 3.6–5.1)
BUN: 7 mg/dL (ref 7–25)
CALCIUM: 8.6 mg/dL (ref 8.6–10.2)
CO2: 17 mmol/L — AB (ref 20–31)
Chloride: 104 mmol/L (ref 98–110)
Creat: 0.5 mg/dL (ref 0.50–1.10)
GLUCOSE: 69 mg/dL (ref 65–99)
POTASSIUM: 3.7 mmol/L (ref 3.5–5.3)
Sodium: 140 mmol/L (ref 135–146)
Total Bilirubin: 0.2 mg/dL (ref 0.2–1.2)
Total Protein: 6.7 g/dL (ref 6.1–8.1)

## 2016-06-13 ENCOUNTER — Encounter: Payer: Self-pay | Admitting: Obstetrics and Gynecology

## 2016-06-13 DIAGNOSIS — O4442 Low lying placenta NOS or without hemorrhage, second trimester: Secondary | ICD-10-CM | POA: Insufficient documentation

## 2016-06-15 ENCOUNTER — Encounter: Payer: Self-pay | Admitting: *Deleted

## 2016-06-16 LAB — ALPHA FETOPROTEIN, MATERNAL
AFP: 44.1 ng/mL
Curr Gest Age: 19.9 weeks
MOM FOR AFP: 0.81
Open Spina bifida: NEGATIVE

## 2016-06-19 IMAGING — US US OB TRANSVAGINAL
1 series · 13 of 28 positions shown · non-contrast
Comparison: Ultrasound 09/14/2015

CLINICAL DATA: Vaginal bleeding. Complicated miscarriage. Cramping.

EXAM:
OBSTETRIC <14 WK US AND TRANSVAGINAL OB US
TECHNIQUE: Both transabdominal and transvaginal ultrasound examinations were
performed for complete evaluation of the gestation as well as the
maternal uterus, adnexal regions, and pelvic cul-de-sac.
Transvaginal technique was performed to assess early pregnancy.

[Series 1: us ob transvaginal · 0.22mm/px · 13 of 84 slices shown]
[im 4/84]
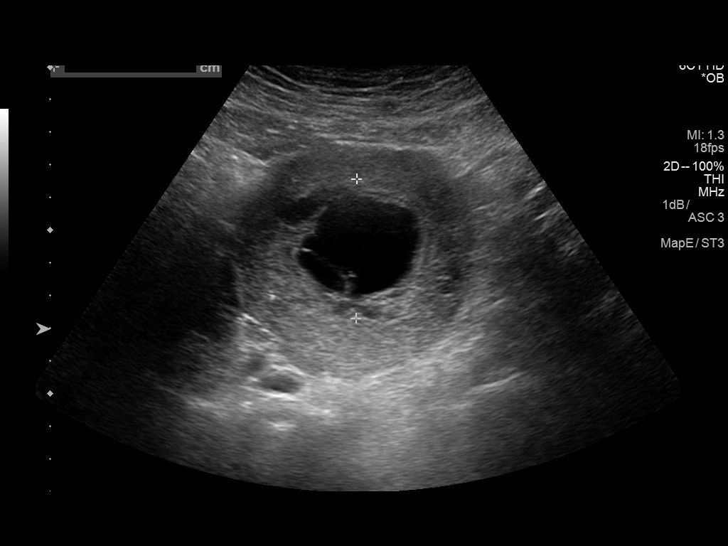
[im 10/84]
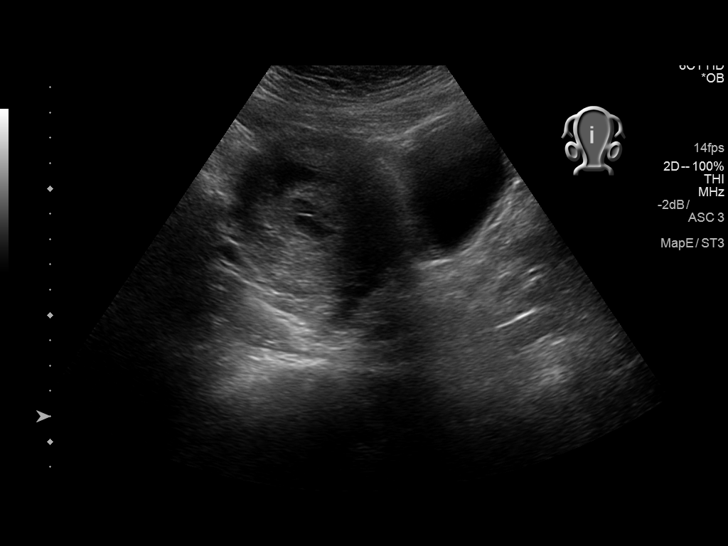
[im 16/84]
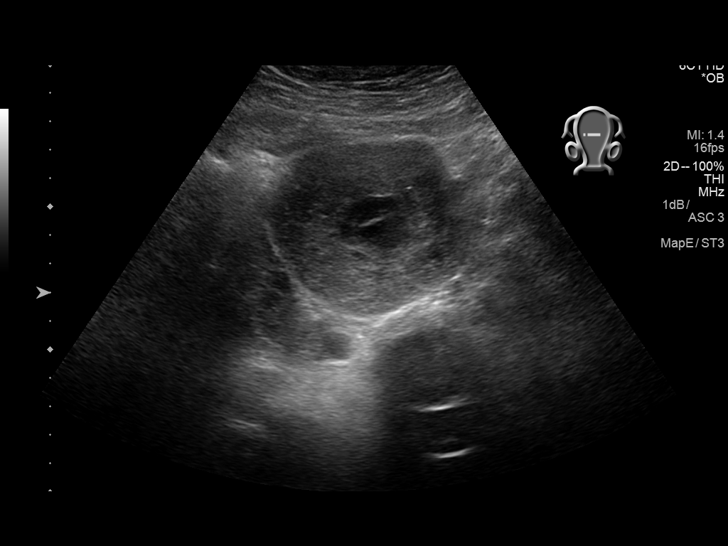
[im 22/84]
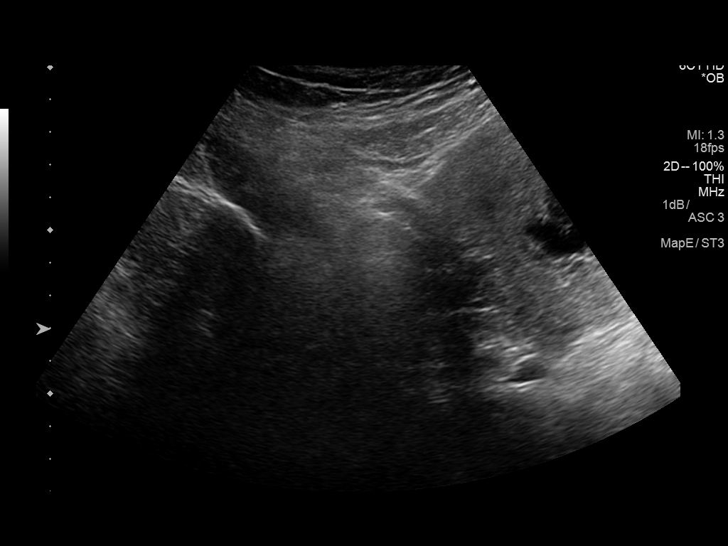
[im 28/84]
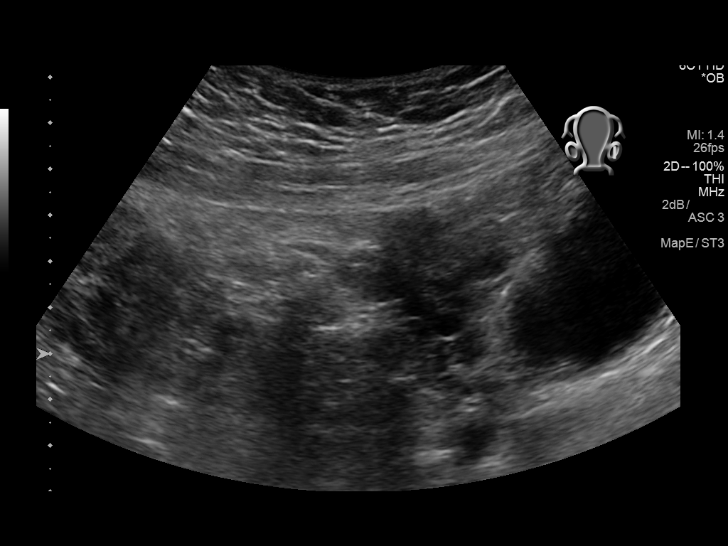
[im 34/84]
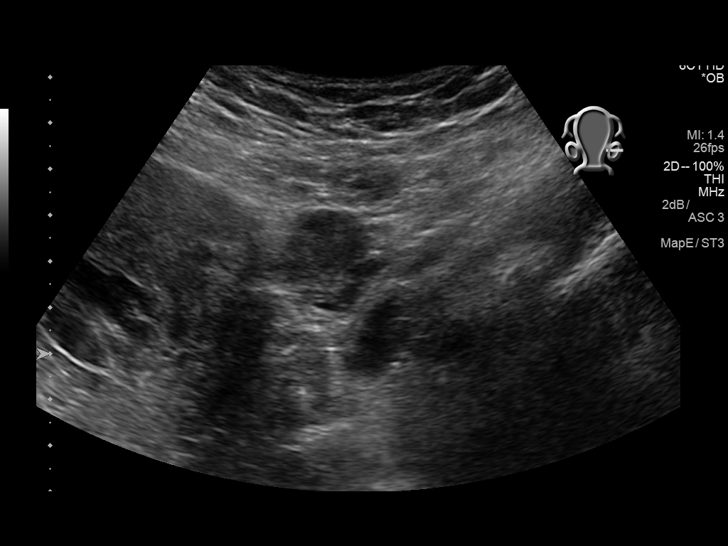
[im 44/84]
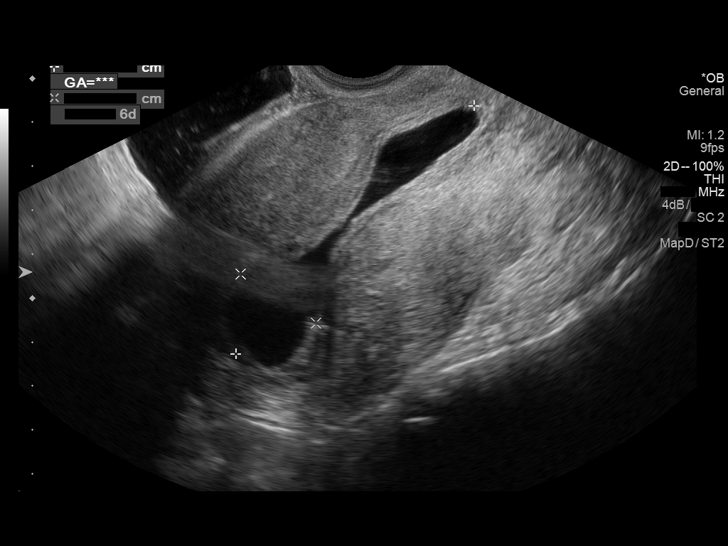
[im 50/84]
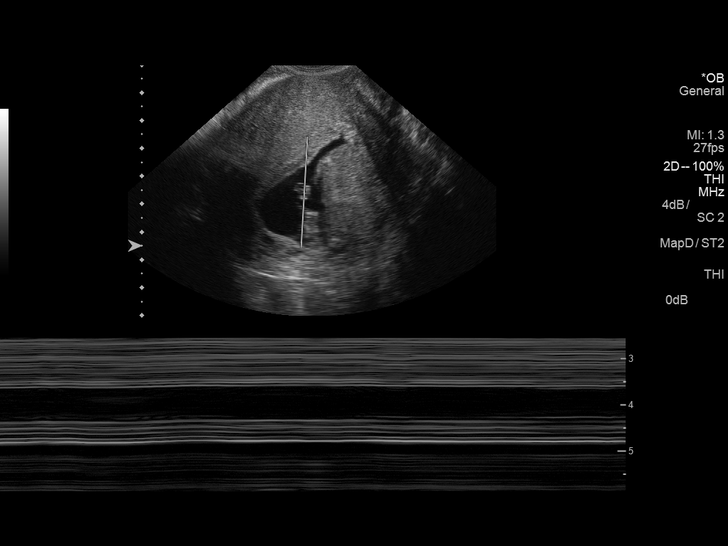
[im 56/84]
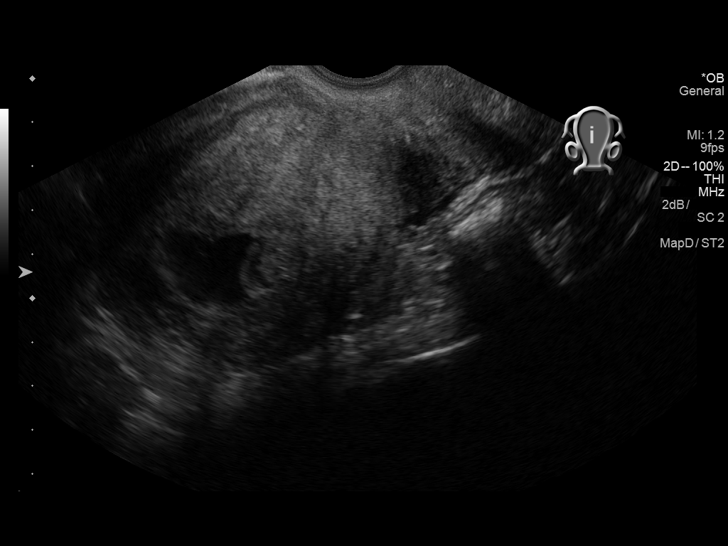
[im 62/84]
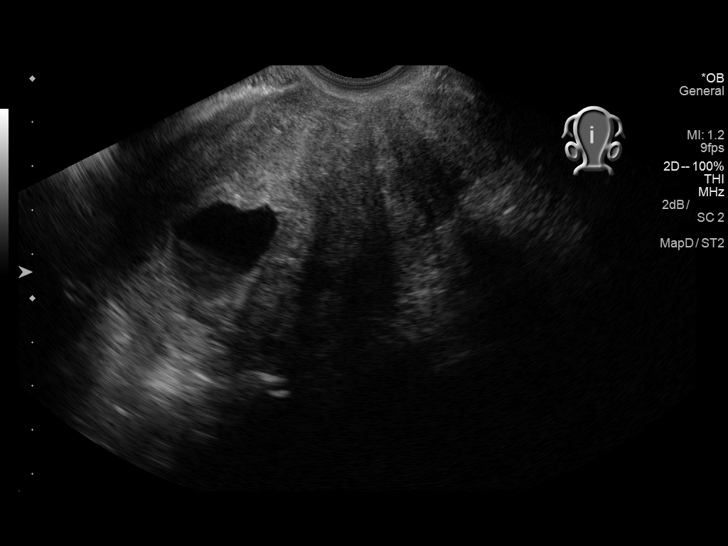
[im 68/84]
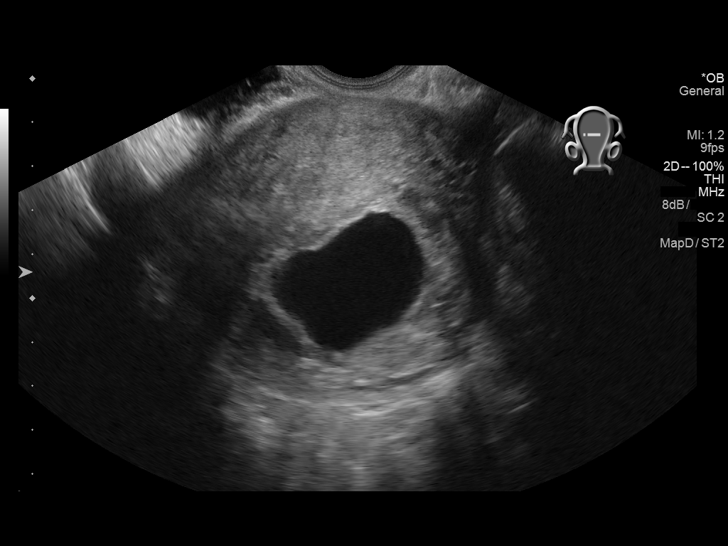
[im 74/84]
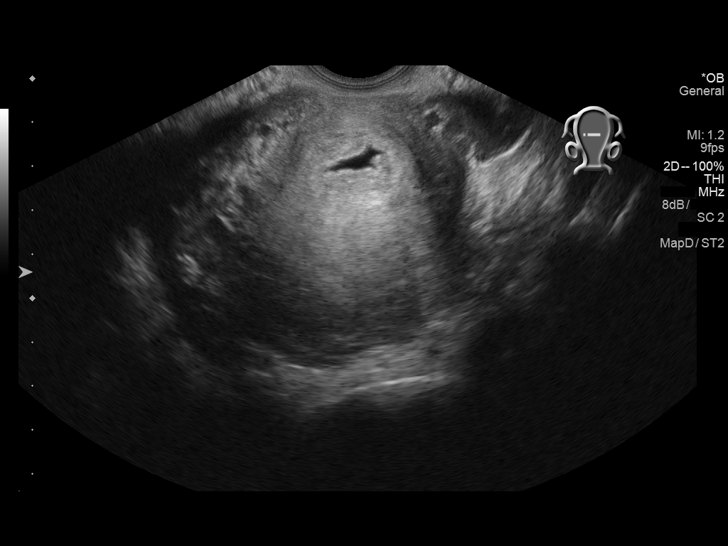
[im 80/84]
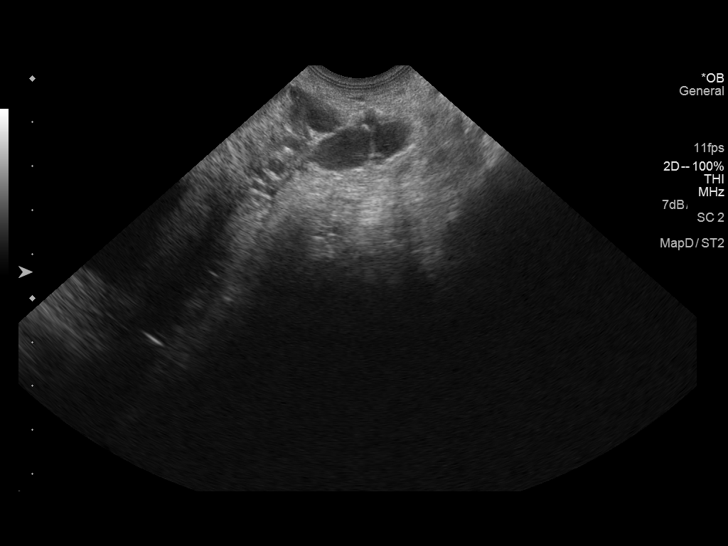

[13 of 28 positions shown; findings below may reference images not displayed]

FINDINGS: Intrauterine gestational sac: Irregularly shaped.

Yolk sac:  None visualized

Embryo:  Visualized

Cardiac Activity: None visualized

MSD: 4.5 cm 10 w   0  d

CRL:  0.8 cm 6 w   for d                  US EDC: 05/09/2016

Subchorionic hemorrhage:  None visualized.

Maternal uterus/adnexae: Trace free pelvic fluid.
IMPRESSION: Irregular in shape gestational sac with fetal pole noted. No fetal
cardiac activity noted. Similar finding was noted on prior
ultrasound 09/14/2015 . Findings are suspicious but not yet
definitive for failed pregnancy. Recommend follow-up US in 10-14
days for definitive diagnosis. This recommendation follows SRU
consensus guidelines: Diagnostic Criteria for Nonviable Pregnancy
Early in the First Trimester. N Engl J Med 3592; [DATE].

## 2016-07-01 ENCOUNTER — Encounter: Payer: BLUE CROSS/BLUE SHIELD | Admitting: Obstetrics & Gynecology

## 2016-07-12 ENCOUNTER — Ambulatory Visit (INDEPENDENT_AMBULATORY_CARE_PROVIDER_SITE_OTHER): Payer: BLUE CROSS/BLUE SHIELD | Admitting: Obstetrics & Gynecology

## 2016-07-12 VITALS — BP 108/75 | HR 82 | Wt 193.0 lb

## 2016-07-12 DIAGNOSIS — Z3490 Encounter for supervision of normal pregnancy, unspecified, unspecified trimester: Secondary | ICD-10-CM

## 2016-07-12 DIAGNOSIS — O4442 Low lying placenta NOS or without hemorrhage, second trimester: Secondary | ICD-10-CM

## 2016-07-12 DIAGNOSIS — Z0489 Encounter for examination and observation for other specified reasons: Secondary | ICD-10-CM

## 2016-07-12 DIAGNOSIS — Z362 Encounter for other antenatal screening follow-up: Secondary | ICD-10-CM

## 2016-07-12 DIAGNOSIS — IMO0002 Reserved for concepts with insufficient information to code with codable children: Secondary | ICD-10-CM

## 2016-07-12 MED ORDER — PRENATAL VITAMIN 27-0.8 MG PO TABS: 1.0000 | ORAL_TABLET | Freq: Every day | ORAL | 11 refills | Status: DC

## 2016-07-12 NOTE — Patient Instructions (Signed)
Return to clinic for any scheduled appointments or obstetric concerns, or go to [facility] for evaluation  

## 2016-07-12 NOTE — Progress Notes (Signed)
   PRENATAL VISIT NOTE  Subjective:  Sherri Brady is a 30 y.o. [id number] at 1247w4d being seen today for ongoing prenatal care.  She is currently monitored for the following issues for this low-risk pregnancy and has Supervision of normal pregnancy, antepartum; Abnormal glucose; Obesity in pregnancy; and Low-lying placenta in second trimester on her problem list.  Patient reports no complaints.  Contractions: Not present. Vag. Bleeding: None.  Movement: Present. Denies leaking of fluid.   The following portions of the patient's history were reviewed and updated as appropriate: allergies, current medications, past family history, past medical history, past social history, past surgical history and problem list. Problem list updated.  Objective:   Vitals:   07/12/16 1538  BP: 108/75  Pulse: 82  Weight: 193 lb (87.5 kg)    Fetal Status: Fetal Heart Rate (bpm): 146   Movement: Present     General:  Alert, oriented and cooperative. Patient is in no acute distress.  Skin: Skin is warm and dry. No rash noted.   Cardiovascular: Normal heart rate noted  Respiratory: Normal respiratory effort, no problems with respiration noted  Abdomen: Soft, gravid, appropriate for gestational age. Pain/Pressure: Absent     Pelvic:  Cervical exam deferred        Extremities: Normal range of motion.  Edema: None  Mental Status: Normal mood and affect. Normal behavior. Normal judgment and thought content.   Assessment and Plan:  Pregnancy: [id number] at 1147w4d  1. Low-lying placenta in second trimester 2. Evaluate anatomy not seen on prior sonogram Limited spine and low lying anterior placenta (1.2 cm from os) on 19 week scan.  - US MFM OB FOLLOW UP; Future  3. Encounter for supervision of normal pregnancy, antepartum, unspecified gravidity Requested PNV refills. - Prenatal Vit-Fe Fumarate-FA (PRENATAL VITAMIN) 27-0.8 MG TABS; Take 1 tablet by mouth daily.  Dispense: 30 tablet; Refill: 11 Preterm labor  symptoms and general obstetric precautions including but not limited to vaginal bleeding, contractions, leaking of fluid and fetal movement were reviewed in detail with the patient. Please refer to After Visit Summary for other counseling recommendations.  Return in about 4 weeks (around 08/09/2016) for 2 hr GTT, 3rd trimester labs, TDap, OB Visit. Of note, patient had abnormal 1 hr GTT and one abnormal value on earlier 3 hr GTT. She understands she is at high risk for GDM.   Tereso NewcomerUgonna A Hillari Zumwalt, MD

## 2016-07-20 ENCOUNTER — Other Ambulatory Visit: Payer: Self-pay | Admitting: Obstetrics & Gynecology

## 2016-07-20 ENCOUNTER — Ambulatory Visit (HOSPITAL_COMMUNITY)
Admission: RE | Admit: 2016-07-20 | Discharge: 2016-07-20 | Disposition: A | Discharge status: Home / Self Care | Payer: BLUE CROSS/BLUE SHIELD | Source: Ambulatory Visit | Attending: Obstetrics & Gynecology | Admitting: Obstetrics & Gynecology

## 2016-07-20 DIAGNOSIS — Z362 Encounter for other antenatal screening follow-up: Secondary | ICD-10-CM | POA: Diagnosis not present

## 2016-07-20 DIAGNOSIS — O99212 Obesity complicating pregnancy, second trimester: Secondary | ICD-10-CM | POA: Diagnosis present

## 2016-07-20 DIAGNOSIS — O4442 Low lying placenta NOS or without hemorrhage, second trimester: Secondary | ICD-10-CM

## 2016-07-20 DIAGNOSIS — Z3A26 26 weeks gestation of pregnancy: Secondary | ICD-10-CM | POA: Insufficient documentation

## 2016-07-20 DIAGNOSIS — IMO0002 Reserved for concepts with insufficient information to code with codable children: Secondary | ICD-10-CM

## 2016-07-20 DIAGNOSIS — Z0489 Encounter for examination and observation for other specified reasons: Secondary | ICD-10-CM

## 2016-08-01 NOTE — L&D Delivery Note (Signed)
Delivery Note At 10:30 AM a viable female was delivered via  (Presentation:vertex ; LOA ).  APGAR 8: ,9 ; weight  .   Placenta status:spont ,shultz .  Cord:3vc  with the following complications:none .  Cord pH: n/a  Anesthesia:  none Episiotomy: none  Lacerations: none  Suture Repair: n/a Est. Blood Loss 150(mL):    Mom to postpartum.  Baby to Couplet care / Skin to Skin.  Sherri Brady 10/30/2016, 10:39 AM

## 2016-08-12 ENCOUNTER — Encounter: Payer: BLUE CROSS/BLUE SHIELD | Admitting: Obstetrics & Gynecology

## 2016-08-26 ENCOUNTER — Ambulatory Visit (INDEPENDENT_AMBULATORY_CARE_PROVIDER_SITE_OTHER): Payer: BLUE CROSS/BLUE SHIELD | Admitting: Obstetrics and Gynecology

## 2016-08-26 VITALS — BP 99/66 | HR 82 | Wt 195.0 lb

## 2016-08-26 DIAGNOSIS — O99212 Obesity complicating pregnancy, second trimester: Secondary | ICD-10-CM

## 2016-08-26 DIAGNOSIS — R7309 Other abnormal glucose: Secondary | ICD-10-CM

## 2016-08-26 DIAGNOSIS — Z3490 Encounter for supervision of normal pregnancy, unspecified, unspecified trimester: Secondary | ICD-10-CM

## 2016-08-26 DIAGNOSIS — O99210 Obesity complicating pregnancy, unspecified trimester: Secondary | ICD-10-CM

## 2016-08-26 DIAGNOSIS — O4442 Low lying placenta NOS or without hemorrhage, second trimester: Secondary | ICD-10-CM

## 2016-08-26 DIAGNOSIS — E669 Obesity, unspecified: Secondary | ICD-10-CM

## 2016-08-26 NOTE — Progress Notes (Signed)
Prenatal Visit Note Date: 08/26/2016 [facility]: [facility]  Subjective:  Lenore MannerFinh Jamerson is a 31 y.o. [id number] at 6272w0d being seen today for ongoing prenatal care.  She is currently monitored for the following issues for this high-risk pregnancy and has Supervision of normal pregnancy, antepartum; Abnormal glucose; Obesity in pregnancy; and Low-lying anterior placenta in second trimester on her problem list.  Patient reports no complaints.   Contractions: Irritability. Vag. Bleeding: None.  Movement: Present. Denies leaking of fluid.   The following portions of the patient's history were reviewed and updated as appropriate: allergies, current medications, past family history, past medical history, past social history, past surgical history and problem list. Problem list updated.  Objective:   Vitals:   08/26/16 0916  BP: 99/66  Pulse: 82  Weight: 195 lb (88.5 kg)    Fetal Status: Fetal Heart Rate (bpm): 140 Fundal Height: 31 cm Movement: Present     General:  Alert, oriented and cooperative. Patient is in no acute distress.  Skin: Skin is warm and dry. No rash noted.   Cardiovascular: Normal heart rate noted  Respiratory: Normal respiratory effort, no problems with respiration noted  Abdomen: Soft, gravid, appropriate for gestational age. Pain/Pressure: Absent     Pelvic:  Cervical exam deferred        Extremities: Normal range of motion.  Edema: None  Mental Status: Normal mood and affect. Normal behavior. Normal judgment and thought content.   Urinalysis:      Assessment and Plan:  Pregnancy: [id number] at 5672w0d  1. Encounter for supervision of normal pregnancy, antepartum, unspecified gravidity Still not fasting today. D/w her importance of this. 2hr nv. BTL vs OCPs. D/w her that since she has some medicaid that if she's still seriously considering it to sign paperwork nv to make her eligible.   2. Low-lying anterior placenta in second trimester F/u u/s  ordered. Pelvic rest and bleeding precautions given  3. Abnormal glucose See above  4. Obesity in pregnancy On baby asa.   Preterm labor symptoms and general obstetric precautions including but not limited to vaginal bleeding, contractions, leaking of fluid and fetal movement were reviewed in detail with the patient. Please refer to After Visit Summary for other counseling recommendations.  Return in about 2 weeks (around 09/09/2016) for Return OB.   [place] Bingharlie Edye Hainline, MD

## 2016-08-29 ENCOUNTER — Encounter: Payer: Self-pay | Admitting: *Deleted

## 2016-09-02 ENCOUNTER — Other Ambulatory Visit: Payer: Self-pay | Admitting: Obstetrics and Gynecology

## 2016-09-02 ENCOUNTER — Ambulatory Visit (HOSPITAL_COMMUNITY)
Admission: RE | Admit: 2016-09-02 | Discharge: 2016-09-02 | Disposition: A | Discharge status: Home / Self Care | Payer: BLUE CROSS/BLUE SHIELD | Source: Ambulatory Visit | Attending: Obstetrics and Gynecology | Admitting: Obstetrics and Gynecology

## 2016-09-02 DIAGNOSIS — Z362 Encounter for other antenatal screening follow-up: Secondary | ICD-10-CM | POA: Diagnosis not present

## 2016-09-02 DIAGNOSIS — O4443 Low lying placenta NOS or without hemorrhage, third trimester: Secondary | ICD-10-CM | POA: Diagnosis not present

## 2016-09-02 DIAGNOSIS — Z3490 Encounter for supervision of normal pregnancy, unspecified, unspecified trimester: Secondary | ICD-10-CM

## 2016-09-02 DIAGNOSIS — Z3A32 32 weeks gestation of pregnancy: Secondary | ICD-10-CM | POA: Diagnosis not present

## 2016-09-09 ENCOUNTER — Encounter: Payer: BLUE CROSS/BLUE SHIELD | Admitting: Obstetrics & Gynecology

## 2016-09-29 ENCOUNTER — Other Ambulatory Visit (HOSPITAL_COMMUNITY)
Admission: RE | Admit: 2016-09-29 | Discharge: 2016-09-29 | Disposition: A | Discharge status: Home / Self Care | Payer: BLUE CROSS/BLUE SHIELD | Source: Ambulatory Visit | Attending: Obstetrics & Gynecology | Admitting: Obstetrics & Gynecology

## 2016-09-29 ENCOUNTER — Encounter: Payer: Self-pay | Admitting: *Deleted

## 2016-09-29 ENCOUNTER — Ambulatory Visit (INDEPENDENT_AMBULATORY_CARE_PROVIDER_SITE_OTHER): Payer: BLUE CROSS/BLUE SHIELD | Admitting: Obstetrics & Gynecology

## 2016-09-29 VITALS — BP 120/75 | HR 73 | Wt 205.0 lb

## 2016-09-29 DIAGNOSIS — Z3483 Encounter for supervision of other normal pregnancy, third trimester: Secondary | ICD-10-CM | POA: Diagnosis not present

## 2016-09-29 DIAGNOSIS — Z23 Encounter for immunization: Secondary | ICD-10-CM | POA: Diagnosis not present

## 2016-09-29 DIAGNOSIS — Z113 Encounter for screening for infections with a predominantly sexual mode of transmission: Secondary | ICD-10-CM | POA: Insufficient documentation

## 2016-09-29 LAB — OB RESULTS CONSOLE GBS: GBS: NEGATIVE

## 2016-09-29 NOTE — Patient Instructions (Signed)
Return to clinic for any scheduled appointments or obstetric concerns, or go to [facility] for evaluation  

## 2016-09-29 NOTE — Progress Notes (Signed)
   PRENATAL VISIT NOTE  Subjective:  Sherri Brady is a 31 y.o. [id number] at 2626w6d being seen today for ongoing prenatal care.  She is currently monitored for the following issues for this low-risk pregnancy and has Supervision of normal pregnancy, antepartum; Abnormal glucose; and Obesity in pregnancy on her problem list.  Patient reports no complaints.  Contractions: Not present. Vag. Bleeding: None.  Movement: Present. Denies leaking of fluid.   The following portions of the patient's history were reviewed and updated as appropriate: allergies, current medications, past family history, past medical history, past social history, past surgical history and problem list. Problem list updated.  Objective:   Vitals:   09/29/16 0821  BP: 120/75  Pulse: 73  Weight: 205 lb (93 kg)    Fetal Status: Fetal Heart Rate (bpm): 137 Fundal Height: 37 cm Movement: Present  Presentation: Vertex  General:  Alert, oriented and cooperative. Patient is in no acute distress.  Skin: Skin is warm and dry. No rash noted.   Cardiovascular: Normal heart rate noted  Respiratory: Normal respiratory effort, no problems with respiration noted  Abdomen: Soft, gravid, appropriate for gestational age. Pain/Pressure: Absent     Pelvic:  Cervical exam performed Dilation: 1 Effacement (%): Thick Station: Ballotable  Extremities: Normal range of motion.  Edema: Trace  Mental Status: Normal mood and affect. Normal behavior. Normal judgment and thought content.   Assessment and Plan:  Pregnancy: [id number] at 2326w6d  1. Encounter for supervision of other normal pregnancy in third trimester Missed last few appointments.  Resolved previa on last scan. Third trimester labs and pelvic cultures done today.  - Glucose Tolerance, 2 Hours w/1 Hour - CBC - HIV antibody - RPR - Tdap vaccine greater than or equal to 7yo IM - Culture, beta strep (group b only) - Cervicovaginal ancillary only  Preterm labor symptoms and general  obstetric precautions including but not limited to vaginal bleeding, contractions, leaking of fluid and fetal movement were reviewed in detail with the patient. Please refer to After Visit Summary for other counseling recommendations.  Return in about 1 week (around 10/06/2016) for OB Visit.   Tereso NewcomerUgonna A Yonah Tangeman, MD

## 2016-09-30 LAB — GLUCOSE TOLERANCE, 2 HOURS W/ 1HR
GLUCOSE, 2 HOUR: 128 mg/dL (ref 65–152)
GLUCOSE, FASTING: 71 mg/dL (ref 65–91)
Glucose, 1 hour: 142 mg/dL (ref 65–179)

## 2016-09-30 LAB — CBC
HEMATOCRIT: 36.1 % (ref 34.0–46.6)
Hemoglobin: 11.8 g/dL (ref 11.1–15.9)
MCH: 26.8 pg (ref 26.6–33.0)
MCHC: 32.7 g/dL (ref 31.5–35.7)
MCV: 82 fL (ref 79–97)
PLATELETS: 294 10*3/uL (ref 150–379)
RBC: 4.4 x10E6/uL (ref 3.77–5.28)
RDW: 13.7 % (ref 12.3–15.4)
WBC: 14.1 10*3/uL — ABNORMAL HIGH (ref 3.4–10.8)

## 2016-09-30 LAB — HIV ANTIBODY (ROUTINE TESTING W REFLEX): HIV Screen 4th Generation wRfx: NONREACTIVE

## 2016-09-30 LAB — RPR: RPR: NONREACTIVE

## 2016-09-30 LAB — CERVICOVAGINAL ANCILLARY ONLY
Chlamydia: NEGATIVE
Neisseria Gonorrhea: NEGATIVE

## 2016-10-03 LAB — CULTURE, BETA STREP (GROUP B ONLY): Strep Gp B Culture: NEGATIVE

## 2016-10-06 ENCOUNTER — Ambulatory Visit (INDEPENDENT_AMBULATORY_CARE_PROVIDER_SITE_OTHER): Payer: BLUE CROSS/BLUE SHIELD | Admitting: Obstetrics & Gynecology

## 2016-10-06 VITALS — BP 121/79 | HR 97 | Wt 205.0 lb

## 2016-10-06 DIAGNOSIS — R7309 Other abnormal glucose: Secondary | ICD-10-CM

## 2016-10-06 DIAGNOSIS — O99210 Obesity complicating pregnancy, unspecified trimester: Secondary | ICD-10-CM

## 2016-10-06 DIAGNOSIS — Z3483 Encounter for supervision of other normal pregnancy, third trimester: Secondary | ICD-10-CM

## 2016-10-06 DIAGNOSIS — E669 Obesity, unspecified: Secondary | ICD-10-CM

## 2016-10-06 DIAGNOSIS — O99213 Obesity complicating pregnancy, third trimester: Secondary | ICD-10-CM

## 2016-10-06 DIAGNOSIS — Z3480 Encounter for supervision of other normal pregnancy, unspecified trimester: Secondary | ICD-10-CM

## 2016-10-06 NOTE — Progress Notes (Signed)
   PRENATAL VISIT NOTE  Subjective:  Sherri Brady is a 31 y.o. [id number] at 3450w6d being seen today for ongoing prenatal care.  She is currently monitored for the following issues for this low-risk pregnancy and has Supervision of normal pregnancy, antepartum; Abnormal glucose; and Obesity in pregnancy on her problem list.  Patient reports no complaints.  Contractions: Irritability. Vag. Bleeding: None.  Movement: Present. Denies leaking of fluid.   The following portions of the patient's history were reviewed and updated as appropriate: allergies, current medications, past family history, past medical history, past social history, past surgical history and problem list. Problem list updated.  Objective:   Vitals:   10/06/16 0901  BP: 121/79  Pulse: 97  Weight: 205 lb (93 kg)    Fetal Status: Fetal Heart Rate (bpm): 138   Movement: Present     General:  Alert, oriented and cooperative. Patient is in no acute distress.  Skin: Skin is warm and dry. No rash noted.   Cardiovascular: Normal heart rate noted  Respiratory: Normal respiratory effort, no problems with respiration noted  Abdomen: Soft, gravid, appropriate for gestational age. Pain/Pressure: Absent     Pelvic:  Cervical exam performed        Extremities: Normal range of motion.  Edema: Trace  Mental Status: Normal mood and affect. Normal behavior. Normal judgment and thought content.   Assessment and Plan:  Pregnancy: [id number] at 2250w6d  1. Supervision of other normal pregnancy, antepartum   2. Obesity in pregnancy   3. Abnormal glucose - Normal 2 hour GTT  Preterm labor symptoms and general obstetric precautions including but not limited to vaginal bleeding, contractions, leaking of fluid and fetal movement were reviewed in detail with the patient. Please refer to After Visit Summary for other counseling recommendations.  No Follow-up on file.   Allie BossierMyra C Viraaj Vorndran, MD

## 2016-10-13 ENCOUNTER — Encounter: Payer: BLUE CROSS/BLUE SHIELD | Admitting: Obstetrics & Gynecology

## 2016-10-24 ENCOUNTER — Inpatient Hospital Stay (HOSPITAL_COMMUNITY)
Admission: AD | Admit: 2016-10-24 | Discharge: 2016-10-24 | Disposition: A | Discharge status: Home / Self Care | Payer: BLUE CROSS/BLUE SHIELD | Source: Ambulatory Visit | Attending: Obstetrics and Gynecology | Admitting: Obstetrics and Gynecology

## 2016-10-24 ENCOUNTER — Encounter (HOSPITAL_COMMUNITY): Payer: Self-pay | Admitting: *Deleted

## 2016-10-24 DIAGNOSIS — Z3A39 39 weeks gestation of pregnancy: Secondary | ICD-10-CM | POA: Insufficient documentation

## 2016-10-24 DIAGNOSIS — O479 False labor, unspecified: Secondary | ICD-10-CM | POA: Diagnosis not present

## 2016-10-24 NOTE — Discharge Instructions (Signed)
Fetal Movement Counts Patient Name: ________________________________________________ Patient Due Date: ____________________ What is a fetal movement count? A fetal movement count is the number of times that you feel your baby move during a certain amount of time. This may also be called a fetal kick count. A fetal movement count is recommended for every pregnant woman. You may be asked to start counting fetal movements as early as week 28 of your pregnancy. Pay attention to when your baby is most active. You may notice your baby's sleep and wake cycles. You may also notice things that make your baby move more. You should do a fetal movement count:  When your baby is normally most active.  At the same time each day. A good time to count movements is while you are resting, after having something to eat and drink. How do I count fetal movements? 1. Find a quiet, comfortable area. Sit, or lie down on your side. 2. Write down the date, the start time and stop time, and the number of movements that you felt between those two times. Take this information with you to your health care visits. 3. For 2 hours, count kicks, flutters, swishes, rolls, and jabs. You should feel at least 10 movements during 2 hours. 4. You may stop counting after you have felt 10 movements. 5. If you do not feel 10 movements in 2 hours, have something to eat and drink. Then, keep resting and counting for 1 hour. If you feel at least 4 movements during that hour, you may stop counting. Contact a health care provider if:  You feel fewer than 4 movements in 2 hours.  Your baby is not moving like he or she usually does. Date: ____________ Start time: ____________ Stop time: ____________ Movements: ____________ Date: ____________ Start time: ____________ Stop time: ____________ Movements: ____________ Date: ____________ Start time: ____________ Stop time: ____________ Movements: ____________ Date: ____________ Start time:  ____________ Stop time: ____________ Movements: ____________ Date: ____________ Start time: ____________ Stop time: ____________ Movements: ____________ Date: ____________ Start time: ____________ Stop time: ____________ Movements: ____________ Date: ____________ Start time: ____________ Stop time: ____________ Movements: ____________ Date: ____________ Start time: ____________ Stop time: ____________ Movements: ____________ Date: ____________ Start time: ____________ Stop time: ____________ Movements: ____________ This information is not intended to replace advice given to you by your health care provider. Make sure you discuss any questions you have with your health care provider. Document Released: 08/17/2006 Document Revised: 03/16/2016 Document Reviewed: 08/27/2015 Elsevier Interactive Patient Education  2017 [employer] Braxton Hicks Contractions Contractions of the uterus can occur throughout pregnancy, but they are not always a sign that you are in labor. You may have practice contractions called Braxton Hicks contractions. These false labor contractions are sometimes confused with true labor. What are Braxton Hicks contractions? Braxton Hicks contractions are tightening movements that occur in the muscles of the uterus before labor. Unlike true labor contractions, these contractions do not result in opening (dilation) and thinning of the cervix. Toward the end of pregnancy (32-34 weeks), Braxton Hicks contractions can happen more often and may become stronger. These contractions are sometimes difficult to tell apart from true labor because they can be very uncomfortable. You should not feel embarrassed if you go to the hospital with false labor. Sometimes, the only way to tell if you are in true labor is for your health care provider to look for changes in the cervix. The health care provider will do a physical exam and may monitor your contractions. If you   are not in true labor, the exam  should show that your cervix is not dilating and your water has not broken. If there are no prenatal problems or other health problems associated with your pregnancy, it is completely safe for you to be sent home with false labor. You may continue to have Braxton Hicks contractions until you go into true labor. How can I tell the difference between true labor and false labor?  Differences  False labor  Contractions last 30-70 seconds.: Contractions are usually shorter and not as strong as true labor contractions.  Contractions become very regular.: Contractions are usually irregular.  Discomfort is usually felt in the top of the uterus, and it spreads to the lower abdomen and low back.: Contractions are often felt in the front of the lower abdomen and in the groin.  Contractions do not go away with walking.: Contractions may go away when you walk around or change positions while lying down.  Contractions usually become more intense and increase in frequency.: Contractions get weaker and are shorter-lasting as time goes on.  The cervix dilates and gets thinner.: The cervix usually does not dilate or become thin. Follow these instructions at home:  Take over-the-counter and prescription medicines only as told by your health care provider.  Keep up with your usual exercises and follow other instructions from your health care provider.  Eat and drink lightly if you think you are going into labor.  If Braxton Hicks contractions are making you uncomfortable:  Change your position from lying down or resting to walking, or change from walking to resting.  Sit and rest in a tub of warm water.  Drink enough fluid to keep your urine clear or pale yellow. Dehydration may cause these contractions.  Do slow and deep breathing several times an hour.  Keep all follow-up prenatal visits as told by your health care provider. This is important. Contact a health care provider if:  You have a  fever.  You have continuous pain in your abdomen. Get help right away if:  Your contractions become stronger, more regular, and closer together.  You have fluid leaking or gushing from your vagina.  You pass blood-tinged mucus (bloody show).  You have bleeding from your vagina.  You have low back pain that you never had before.  You feel your baby's head pushing down and causing pelvic pressure.  Your baby is not moving inside you as much as it used to. Summary  Contractions that occur before labor are called Braxton Hicks contractions, false labor, or practice contractions.  Braxton Hicks contractions are usually shorter, weaker, farther apart, and less regular than true labor contractions. True labor contractions usually become progressively stronger and regular and they become more frequent.  Manage discomfort from Braxton Hicks contractions by changing position, resting in a warm bath, drinking plenty of water, or practicing deep breathing. This information is not intended to replace advice given to you by your health care provider. Make sure you discuss any questions you have with your health care provider. Document Released: 07/18/2005 Document Revised: 06/06/2016 Document Reviewed: 06/06/2016 Elsevier Interactive Patient Education  2017 [employer] Inc[employer]  

## 2016-10-24 NOTE — MAU Note (Signed)
I have communicated with Venia CarbonJennifer Rasch, NP and reviewed vital signs:  Vitals:   10/24/16 1001  BP: 123/71  Pulse: 94  Resp: 18  Temp: 98.4 F (36.9 C)    Vaginal exam:  Dilation: 1 Effacement (%): Thick Cervical Position: Posterior Station: -3 Presentation: Vertex Exam by:: Janeth Rasehristina Robinson RN,   Also reviewed contraction pattern and that non-stress test is reactive.  It has been documented that patient is contracting every 2-5 minutes with no cervical change since OB office visit on 3/8  not indicating active labor.  Patient denies any other complaints.  Based on this report provider has given order for discharge.  A discharge order and diagnosis entered by a provider.   Labor discharge instructions reviewed with patient.

## 2016-10-24 NOTE — MAU Note (Signed)
Patient c/o contractions that started last night and have been off and on; states this am they were 5 minutes apart but seem to have spaced out now upon arrival. Denies LOF, VB at this time. +FM. States at last cervical check a couple weeks ago she was 1 cm. Patient also endorses no longer being on restrictions; previa resolved--confirm with prenatal records.

## 2016-10-25 ENCOUNTER — Ambulatory Visit (INDEPENDENT_AMBULATORY_CARE_PROVIDER_SITE_OTHER): Payer: BLUE CROSS/BLUE SHIELD | Admitting: Obstetrics and Gynecology

## 2016-10-25 VITALS — BP 128/81 | HR 89 | Wt 206.0 lb

## 2016-10-25 DIAGNOSIS — O99210 Obesity complicating pregnancy, unspecified trimester: Secondary | ICD-10-CM

## 2016-10-25 DIAGNOSIS — Z3483 Encounter for supervision of other normal pregnancy, third trimester: Secondary | ICD-10-CM

## 2016-10-25 DIAGNOSIS — O99213 Obesity complicating pregnancy, third trimester: Secondary | ICD-10-CM

## 2016-10-25 DIAGNOSIS — Z3480 Encounter for supervision of other normal pregnancy, unspecified trimester: Secondary | ICD-10-CM

## 2016-10-25 DIAGNOSIS — E669 Obesity, unspecified: Secondary | ICD-10-CM

## 2016-10-25 NOTE — Progress Notes (Signed)
   PRENATAL VISIT NOTE  Subjective:  Sherri Brady is a 31 y.o. [id number] at 1736w4d being seen today for ongoing prenatal care.  She is currently monitored for the following issues for this low-risk pregnancy and has Supervision of normal pregnancy, antepartum; Abnormal glucose; and Obesity in pregnancy on her problem list.  Patient reports no complaints.  Contractions: Not present. Vag. Bleeding: None.  Movement: Present. Denies leaking of fluid.   The following portions of the patient's history were reviewed and updated as appropriate: allergies, current medications, past family history, past medical history, past social history, past surgical history and problem list. Problem list updated.  Objective:   Vitals:   10/25/16 1534  BP: 128/81  Pulse: 89  Weight: 206 lb (93.4 kg)    Fetal Status: Fetal Heart Rate (bpm): 138 Fundal Height: 38 cm Movement: Present     General:  Alert, oriented and cooperative. Patient is in no acute distress.  Skin: Skin is warm and dry. No rash noted.   Cardiovascular: Normal heart rate noted  Respiratory: Normal respiratory effort, no problems with respiration noted  Abdomen: Soft, gravid, appropriate for gestational age. Pain/Pressure: Absent     Pelvic:  Cervical exam deferred        Extremities: Normal range of motion.  Edema: Trace  Mental Status: Normal mood and affect. Normal behavior. Normal judgment and thought content.   Assessment and Plan:  Pregnancy: [id number] at 3436w4d  1. Supervision of other normal pregnancy, antepartum Patient is doing well without complaints  2. Obesity in pregnancy   Term labor symptoms and general obstetric precautions including but not limited to vaginal bleeding, contractions, leaking of fluid and fetal movement were reviewed in detail with the patient. Please refer to After Visit Summary for other counseling recommendations.  Return in about 1 week (around 11/01/2016) for ROB.   Catalina AntiguaPeggy Cormac Wint, MD

## 2016-10-26 ENCOUNTER — Encounter (INDEPENDENT_AMBULATORY_CARE_PROVIDER_SITE_OTHER): Payer: Self-pay | Admitting: *Deleted

## 2016-10-27 ENCOUNTER — Encounter (HOSPITAL_COMMUNITY): Payer: Self-pay | Admitting: *Deleted

## 2016-10-27 ENCOUNTER — Telehealth (HOSPITAL_COMMUNITY): Payer: Self-pay | Admitting: *Deleted

## 2016-10-27 NOTE — Telephone Encounter (Signed)
Preadmission screen  

## 2016-10-30 ENCOUNTER — Encounter (HOSPITAL_COMMUNITY): Payer: Self-pay

## 2016-10-30 ENCOUNTER — Inpatient Hospital Stay (HOSPITAL_COMMUNITY)
Admission: AD | Admit: 2016-10-30 | Discharge: 2016-10-31 | DRG: 775 | Disposition: A | Discharge status: Home / Self Care | Payer: BLUE CROSS/BLUE SHIELD | Source: Ambulatory Visit | Attending: Obstetrics and Gynecology | Admitting: Obstetrics and Gynecology

## 2016-10-30 DIAGNOSIS — O99334 Smoking (tobacco) complicating childbirth: Secondary | ICD-10-CM | POA: Diagnosis present

## 2016-10-30 DIAGNOSIS — Z3A40 40 weeks gestation of pregnancy: Secondary | ICD-10-CM

## 2016-10-30 DIAGNOSIS — Z3493 Encounter for supervision of normal pregnancy, unspecified, third trimester: Secondary | ICD-10-CM | POA: Diagnosis present

## 2016-10-30 DIAGNOSIS — F17210 Nicotine dependence, cigarettes, uncomplicated: Secondary | ICD-10-CM | POA: Diagnosis present

## 2016-10-30 DIAGNOSIS — Z3480 Encounter for supervision of other normal pregnancy, unspecified trimester: Secondary | ICD-10-CM

## 2016-10-30 LAB — CBC
HEMATOCRIT: 38.4 % (ref 36.0–46.0)
HEMOGLOBIN: 12.6 g/dL (ref 12.0–15.0)
MCH: 27 pg (ref 26.0–34.0)
MCHC: 32.8 g/dL (ref 30.0–36.0)
MCV: 82.4 fL (ref 78.0–100.0)
Platelets: 266 10*3/uL (ref 150–400)
RBC: 4.66 MIL/uL (ref 3.87–5.11)
RDW: 14.3 % (ref 11.5–15.5)
WBC: 13.9 10*3/uL — ABNORMAL HIGH (ref 4.0–10.5)

## 2016-10-30 LAB — COMPREHENSIVE METABOLIC PANEL
ALK PHOS: 128 U/L — AB (ref 38–126)
ALT: 10 U/L — AB (ref 14–54)
ANION GAP: 9 (ref 5–15)
AST: 16 U/L (ref 15–41)
Albumin: 2.8 g/dL — ABNORMAL LOW (ref 3.5–5.0)
BUN: 9 mg/dL (ref 6–20)
CALCIUM: 8.5 mg/dL — AB (ref 8.9–10.3)
CO2: 22 mmol/L (ref 22–32)
CREATININE: 0.49 mg/dL (ref 0.44–1.00)
Chloride: 105 mmol/L (ref 101–111)
Glucose, Bld: 80 mg/dL (ref 65–99)
Potassium: 4.1 mmol/L (ref 3.5–5.1)
SODIUM: 136 mmol/L (ref 135–145)
Total Bilirubin: 0.3 mg/dL (ref 0.3–1.2)
Total Protein: 6.8 g/dL (ref 6.5–8.1)

## 2016-10-30 LAB — TYPE AND SCREEN
ABO/RH(D): O POS
ANTIBODY SCREEN: NEGATIVE

## 2016-10-30 LAB — ABO/RH: ABO/RH(D): O POS

## 2016-10-30 MED ORDER — OXYTOCIN 40 UNITS IN LACTATED RINGERS INFUSION - SIMPLE MED
2.5000 [IU]/h | INTRAVENOUS | Status: DC
  Filled 2016-10-30: qty 1000

## 2016-10-30 MED ORDER — SENNOSIDES-DOCUSATE SODIUM 8.6-50 MG PO TABS
2.0000 | ORAL_TABLET | ORAL | Status: DC
  Administered 2016-10-30: 2 via ORAL
  Filled 2016-10-30: qty 2

## 2016-10-30 MED ORDER — SODIUM CHLORIDE 0.9% FLUSH: 3.0000 mL | INTRAVENOUS | Status: DC | PRN

## 2016-10-30 MED ORDER — LACTATED RINGERS IV SOLN
INTRAVENOUS | Status: DC
  Administered 2016-10-30: 10:00:00 via INTRAVENOUS

## 2016-10-30 MED ORDER — OXYCODONE-ACETAMINOPHEN 5-325 MG PO TABS: 1.0000 | ORAL_TABLET | ORAL | Status: DC | PRN

## 2016-10-30 MED ORDER — OXYTOCIN BOLUS FROM INFUSION
500.0000 mL | Freq: Once | INTRAVENOUS | Status: AC
  Administered 2016-10-30: 500 mL via INTRAVENOUS

## 2016-10-30 MED ORDER — LIDOCAINE HCL (PF) 1 % IJ SOLN
30.0000 mL | INTRAMUSCULAR | Status: DC | PRN
  Filled 2016-10-30: qty 30

## 2016-10-30 MED ORDER — TETANUS-DIPHTH-ACELL PERTUSSIS 5-2.5-18.5 LF-MCG/0.5 IM SUSP: 0.5000 mL | Freq: Once | INTRAMUSCULAR | Status: DC

## 2016-10-30 MED ORDER — ONDANSETRON HCL 4 MG PO TABS: 4.0000 mg | ORAL_TABLET | ORAL | Status: DC | PRN

## 2016-10-30 MED ORDER — ONDANSETRON HCL 4 MG/2ML IJ SOLN: 4.0000 mg | Freq: Four times a day (QID) | INTRAMUSCULAR | Status: DC | PRN

## 2016-10-30 MED ORDER — WITCH HAZEL-GLYCERIN EX PADS
1.0000 "application " | MEDICATED_PAD | CUTANEOUS | Status: DC | PRN
  Administered 2016-10-30: 1 via TOPICAL

## 2016-10-30 MED ORDER — ONDANSETRON HCL 4 MG/2ML IJ SOLN: 4.0000 mg | INTRAMUSCULAR | Status: DC | PRN

## 2016-10-30 MED ORDER — IBUPROFEN 600 MG PO TABS
600.0000 mg | ORAL_TABLET | Freq: Four times a day (QID) | ORAL | Status: DC
  Administered 2016-10-30 – 2016-10-31 (×4): 600 mg via ORAL
  Filled 2016-10-30 (×5): qty 1

## 2016-10-30 MED ORDER — SOD CITRATE-CITRIC ACID 500-334 MG/5ML PO SOLN: 30.0000 mL | ORAL | Status: DC | PRN

## 2016-10-30 MED ORDER — PRENATAL MULTIVITAMIN CH
1.0000 | ORAL_TABLET | Freq: Every day | ORAL | Status: DC
  Administered 2016-10-30 – 2016-10-31 (×2): 1 via ORAL
  Filled 2016-10-30 (×2): qty 1

## 2016-10-30 MED ORDER — OXYCODONE-ACETAMINOPHEN 5-325 MG PO TABS: 2.0000 | ORAL_TABLET | ORAL | Status: DC | PRN

## 2016-10-30 MED ORDER — FENTANYL CITRATE (PF) 100 MCG/2ML IJ SOLN
INTRAMUSCULAR | Status: AC
  Filled 2016-10-30: qty 2

## 2016-10-30 MED ORDER — LACTATED RINGERS IV SOLN: 500.0000 mL | INTRAVENOUS | Status: DC | PRN

## 2016-10-30 MED ORDER — FLEET ENEMA 7-19 GM/118ML RE ENEM: 1.0000 | ENEMA | RECTAL | Status: DC | PRN

## 2016-10-30 MED ORDER — FENTANYL CITRATE (PF) 100 MCG/2ML IJ SOLN: 100.0000 ug | INTRAMUSCULAR | Status: DC | PRN

## 2016-10-30 MED ORDER — SIMETHICONE 80 MG PO CHEW: 80.0000 mg | CHEWABLE_TABLET | ORAL | Status: DC | PRN

## 2016-10-30 MED ORDER — BENZOCAINE-MENTHOL 20-0.5 % EX AERO
1.0000 "application " | INHALATION_SPRAY | CUTANEOUS | Status: DC | PRN
  Administered 2016-10-30: 1 via TOPICAL
  Filled 2016-10-30: qty 56

## 2016-10-30 MED ORDER — ACETAMINOPHEN 325 MG PO TABS: 650.0000 mg | ORAL_TABLET | ORAL | Status: DC | PRN

## 2016-10-30 MED ORDER — ZOLPIDEM TARTRATE 5 MG PO TABS: 5.0000 mg | ORAL_TABLET | Freq: Every evening | ORAL | Status: DC | PRN

## 2016-10-30 MED ORDER — DIPHENHYDRAMINE HCL 25 MG PO CAPS: 25.0000 mg | ORAL_CAPSULE | Freq: Four times a day (QID) | ORAL | Status: DC | PRN

## 2016-10-30 MED ORDER — DIBUCAINE 1 % RE OINT: 1.0000 "application " | TOPICAL_OINTMENT | RECTAL | Status: DC | PRN

## 2016-10-30 MED ORDER — SODIUM CHLORIDE 0.9% FLUSH
3.0000 mL | Freq: Two times a day (BID) | INTRAVENOUS | Status: DC
  Administered 2016-10-30: 3 mL via INTRAVENOUS

## 2016-10-30 MED ORDER — MEASLES, MUMPS & RUBELLA VAC ~~LOC~~ INJ
0.5000 mL | INJECTION | Freq: Once | SUBCUTANEOUS | Status: DC
  Filled 2016-10-30: qty 0.5

## 2016-10-30 MED ORDER — SODIUM CHLORIDE 0.9 % IV SOLN: 250.0000 mL | INTRAVENOUS | Status: DC | PRN

## 2016-10-30 MED ORDER — ACETAMINOPHEN 325 MG PO TABS
650.0000 mg | ORAL_TABLET | ORAL | Status: DC | PRN
  Administered 2016-10-30: 650 mg via ORAL
  Filled 2016-10-30: qty 2

## 2016-10-30 MED ORDER — COCONUT OIL OIL
1.0000 | TOPICAL_OIL | Status: DC | PRN
Start: 2016-10-30 — End: 2016-10-31

## 2016-10-30 NOTE — Progress Notes (Signed)
Informed Zerita Boers, CNM of elevated blood pressure. Only sign/symptom of Pre-E is some mild to moderate swelling in leg. Placed order for CMP

## 2016-10-30 NOTE — MAU Note (Signed)
Patient presents with contractions every 10 minutes since 5:00 am

## 2016-10-30 NOTE — MAU Note (Signed)
Urine in lab 

## 2016-10-30 NOTE — H&P (Signed)
Sherri Brady is a 31 y.o. female presenting for active labor @ 40.2. GBS neg. OB History    Gravida Para Term Preterm AB Living   0 3 2   SAB TAB Ectopic Multiple Live Births   3 0 0 0 2      Obstetric Comments   G1: 03/2004, TSVD 8lbs, no issues; G2: 03/2005 TSVD, 8lbs; G3 07/2013 SAB D&C; G4 2015 SAB; G5 2017 SAB.  All SABs were early.      Past Medical History:  Diagnosis Date  . Medical history non-contributory    Past Surgical History:  Procedure Laterality Date  . DILATION AND CURETTAGE OF UTERUS     Family History: family history is not on file. Social History:  reports that she has been smoking.  She has been smoking about 0.50 packs per day. She has never used smokeless tobacco. She reports that she does not drink alcohol or use drugs.     Maternal Diabetes: No Genetic Screening: Normal Maternal Ultrasounds/Referrals: Normal Fetal Ultrasounds or other Referrals:  None Maternal Substance Abuse:  No Significant Maternal Medications:  None Significant Maternal Lab Results:  None Other Comments:  None  ROS Maternal Medical History:  Reason for admission: Contractions.   Contractions: Onset was 3-5 hours ago.   Frequency: regular.   Perceived severity is strong.    Fetal activity: Perceived fetal activity is normal.   Last perceived fetal movement was within the past hour.    Prenatal complications: no prenatal complications Prenatal Complications - Diabetes: none.    Dilation: 6 Effacement (%): 70 Station: -2 Exam by:: Laurell Josephs RN Blood pressure 136/86, pulse 82, temperature 97.8 F (36.6 C), resp. rate 18, height  (1.549 m), weight 210 lb 1.3 oz (95.3 kg), last menstrual period 01/22/2016. Maternal Exam:  Uterine Assessment: Contraction strength is firm.  Contraction frequency is regular.   Abdomen: Patient reports no abdominal tenderness. Fetal presentation: vertex  Introitus: Normal vulva. Normal vagina.  Ferning test: not done.   Nitrazine test: not done. Amniotic fluid character: meconium stained.  Pelvis: adequate for delivery.   Cervix: Cervix evaluated by digital exam.     Fetal Exam Fetal Monitor Review: Mode: ultrasound.   Variability: moderate (6-25 bpm).   Pattern: accelerations present.    Fetal State Assessment: Category I - tracings are normal.     Physical Exam  Constitutional: She is oriented to person, place, and time. She appears well-developed and well-nourished.  HENT:  Head: Normocephalic.  Eyes: Pupils are equal, round, and reactive to light.  Neck: Normal range of motion.  Cardiovascular: Normal rate, normal heart sounds and intact distal pulses.   Respiratory: Effort normal and breath sounds normal.  GI: Soft. Bowel sounds are normal.  Musculoskeletal: Normal range of motion.  Neurological: She is alert and oriented to person, place, and time. She has normal reflexes.  Skin: Skin is warm and dry.  Psychiatric: She has a normal mood and affect. Her behavior is normal. Judgment and thought content normal.    Prenatal labs: ABO, Rh: --/--/O POS (09/09 0409) Antibody: Negative (08/25 0000) Rubella: Nonimmune (08/25 0000) RPR: Non Reactive (03/01 0815)  HBsAg: Negative (08/25 0000)  HIV: Non Reactive (03/01 0815)  GBS:     Assessment/Plan: SVE 6/100/-1, GBS neg. Admit antisipate vag delivery   Wyvonnia Dusky 10/30/2016, 10:02 AM

## 2016-10-31 ENCOUNTER — Encounter: Payer: BLUE CROSS/BLUE SHIELD | Admitting: Obstetrics and Gynecology

## 2016-10-31 LAB — RPR: RPR Ser Ql: NONREACTIVE

## 2016-10-31 MED ORDER — PRENATAL MULTIVITAMIN CH: 1.0000 | ORAL_TABLET | Freq: Every day | ORAL | 11 refills | Status: DC

## 2016-10-31 MED ORDER — IBUPROFEN 600 MG PO TABS: 600.0000 mg | ORAL_TABLET | Freq: Four times a day (QID) | ORAL | 0 refills | Status: DC

## 2016-10-31 NOTE — Plan of Care (Signed)
Problem: Coping: Goal: Ability to identify and utilize available resources and services will improve Discharge education reviewed with patient and significant other. Patient verbalizes understanding of information.

## 2016-10-31 NOTE — Lactation Note (Signed)
This note was copied from a baby's chart. Lactation Consultation Note  Patient Name: Sherri Brady [id number] Date: 10/31/2016 Reason for consult: Initial assessment   Initial consult with mom of 22 hour old infant. Infant with 2 BF and 1 formula feeding since birth. Mom reports she BF her older 2 children for 2-3 weeks and stopped due to pain with feeding.   Mom reports infant has been sleepy. She reports infant did feed this morning and that feeding was painful. Infant is asleep currently. Offered to assist mom with feeding to see if we can decrease pain, mom declined saying she will be alright and she is aware of what to do. Discussed supply and demand, milk coming to volume, BF basics, flanging lips, wait for wide open mouth for latch, pillow support, nipple care, engorgement prevention/treatment, I/O and breast milk handling and storage. Left LC phone # on board for mom to call for feeding assistance before d/c.   Mom reports she knows how to hand express but finds it difficult to get colostrum out, she did not want assistance. Enc mom to hand express or pump to stimulate milk production if she would like to make a milk supply. Mom reports she has an electric pump at home for use. Mom is working on scheduling a follow up ped appt.(mom reports they live in [place])   BF Resources Handout and [facility] Brochure given, mom informed of IP/OP services, BF Support Groups and LC phone #. Enc mom to call with questions/concerns prn. Mom without questions/concerns at this time.   Maternal Data Formula Feeding for Exclusion: No Has patient been taught Hand Expression?: Yes Does the patient have breastfeeding experience prior to this delivery?: Yes  Feeding Feeding Type: Bottle Fed - Formula  LATCH Score/Interventions                      Lactation Tools Discussed/Used WIC Program: Yes Pump Review: Setup, frequency, and cleaning;Milk Storage   Consult Status Consult Status:  Complete Follow-up type: Call as needed    Ed Blalock 10/31/2016, 9:03 AM

## 2016-10-31 NOTE — Discharge Summary (Signed)
OB Discharge Summary  Patient Name: Sherri Brady DOB: 12-Mar-1986 MRN: [id number]  Date of admission: 10/30/2016 Delivering MD: Wyvonnia Dusky D   Date of discharge: 10/31/2016  Admitting diagnosis: 40 wks ctx every 10 min Intrauterine pregnancy: [redacted]w[redacted]d     Secondary diagnosis:Active Problems:   Normal labor  Additional problems:none     Discharge diagnosis: Term Pregnancy Delivered                                                                     Post partum procedures:n/a  Augmentation: Pitocin  Complications: None  Hospital course:  Onset of Labor With Vaginal Delivery     31 y.o. yo [phone] at [redacted]w[redacted]d was admitted in Active Labor on 10/30/2016. Patient had an uncomplicated labor course as follows:  Membrane Rupture Time/Date: 9:55 AM ,10/30/2016   Intrapartum Procedures: Episiotomy: None [1]                                         Lacerations:  None [1]  Patient had a delivery of a Viable infant. 10/30/2016  Information for the patient's newborn:  Jakalyn, [place] Girl Lachandra [[id number]]  Delivery Method: Vag-Spont    Pateint had an uncomplicated postpartum course.  She is ambulating, tolerating a regular diet, passing flatus, and urinating well. Patient is discharged home in stable condition on 10/31/16.   Physical exam  Vitals:   10/30/16 1309 10/30/16 1730 10/31/16 0109 10/31/16 0629  BP: (!) 126/96 133/79 133/79 125/82  Pulse: (!) 57 76 70 91  Resp: Temp: 98.4 F (36.9 C) 99.1 F (37.3 C) 99.5 F (37.5 C) 98.1 F (36.7 C)  TempSrc: Oral Oral Oral Oral  SpO2:      Weight:      Height:       General: alert, cooperative and no distress Lochia: appropriate Uterine Fundus: firm Incision: N/A DVT Evaluation: No evidence of DVT seen on physical exam. Labs: Lab Results  Component Value Date   WBC 13.9 (H) 10/30/2016   HGB 12.6 10/30/2016   HCT 38.4 10/30/2016   MCV 82.4 10/30/2016   PLT 266 10/30/2016   CMP Latest Ref Rng & Units 10/30/2016   Glucose 65 - 99 mg/dL 80  BUN 6 - 20 mg/dL 9  Creatinine 3.66 - 4.40 mg/dL 3.47  Sodium 425 - 956 mmol/L 136  Potassium 3.5 - 5.1 mmol/L 4.1  Chloride 101 - 111 mmol/L 105  CO2 22 - 32 mmol/L 22  Calcium 8.9 - 10.3 mg/dL 3.8(V)  Total Protein 6.5 - 8.1 g/dL 6.8  Total Bilirubin 0.3 - 1.2 mg/dL 0.3  Alkaline Phos 38 - 126 U/L 128(H)  AST 15 - 41 U/L 16  ALT 14 - 54 U/L 10(L)    Discharge instruction: per After Visit Summary and "Baby and Me Booklet".  After Visit Meds:    Diet: routine diet  Activity: Advance as tolerated. Pelvic rest for 6 weeks.   Outpatient follow up:6 weeks Follow up Appt:Future Appointments Date Time Provider Department Center  10/31/2016 1:30 PM Chauvin Bing, MD [facility]   Follow up visit: No Follow-up on  file.  Postpartum contraception: Undecided  Newborn Data: Live born female  Birth Weight: 8 lb 0.2 oz (3634 g) APGAR: 8, 9  Baby Feeding: Breast Disposition:home with mother   10/31/2016 Wyvonnia Dusky, CNM

## 2016-11-01 ENCOUNTER — Encounter (INDEPENDENT_AMBULATORY_CARE_PROVIDER_SITE_OTHER): Payer: Self-pay | Admitting: *Deleted

## 2016-11-04 ENCOUNTER — Inpatient Hospital Stay (HOSPITAL_COMMUNITY): Admission: RE | Admit: 2016-11-04 | Payer: BLUE CROSS/BLUE SHIELD | Source: Ambulatory Visit

## 2016-11-07 ENCOUNTER — Encounter: Payer: BLUE CROSS/BLUE SHIELD | Admitting: Obstetrics and Gynecology

## 2016-12-12 ENCOUNTER — Ambulatory Visit: Payer: BLUE CROSS/BLUE SHIELD | Admitting: Obstetrics and Gynecology

## 2016-12-27 ENCOUNTER — Encounter: Payer: Self-pay | Admitting: Family Medicine

## 2016-12-27 ENCOUNTER — Ambulatory Visit (INDEPENDENT_AMBULATORY_CARE_PROVIDER_SITE_OTHER): Payer: BLUE CROSS/BLUE SHIELD | Admitting: Family Medicine

## 2016-12-27 DIAGNOSIS — Z3041 Encounter for surveillance of contraceptive pills: Secondary | ICD-10-CM

## 2016-12-27 MED ORDER — TRINESSA (28) 0.18/0.215/0.25 MG-35 MCG PO TABS: 1.0000 | ORAL_TABLET | Freq: Every day | ORAL | 5 refills | Status: DC

## 2016-12-27 NOTE — Progress Notes (Signed)
Post Partum Exam  Sherri Brady is a 31 y.o. [id number] female who presents for a postpartum visit. She is 8 weeks postpartum following a spontaneous vaginal delivery. I have fully reviewed the prenatal and intrapartum course. The delivery was at 40.2 gestational weeks.  Anesthesia: none. Postpartum course has been unremarkable. Baby's course has been unremarkable. Baby is feeding by bottle - Similac Soy. Bleeding off and on. Bowel function is normal. Bladder function is normal. Patient is sexually active. [facility] Postpartum depression screening:neg  Pt started OCP approx 1 month ago. She had a regular cycle approx 2 weeks postpartum and has experienced some vaginal spotting since startin OCP  Complains of right hip pain and popping. Happened after she gave birth last time. It eventually got better.  The following portions of the patient's history were reviewed and updated as appropriate: allergies, current medications, past family history, past medical history, past social history, past surgical history and problem list.  Review of Systems Pertinent items noted in HPI and remainder of comprehensive ROS otherwise negative.    Objective:  unknown if currently breastfeeding.  General:  alert, cooperative and appears stated age  Lungs: normal effort  Heart:  regular rate and rhythm  Extremety: Normal full range of motion noted at right hip  Abdomen: soft, non-tender; bowel sounds normal; no masses,  no organomegaly        Assessment:    Normal postpartum exam. Pap smear not done at today's visit.   Plan:   1. Contraception: OCP (estrogen/progesterone)--rx given 2. Pap due 09/2017 3. Follow up in: 1 year or as needed.

## 2016-12-27 NOTE — Patient Instructions (Signed)
Postpartum Depression and Baby Blues The postpartum period begins right after the birth of a baby. During this time, there is often a great amount of joy and excitement. It is also a time of many changes in the life of the parents. Regardless of how many times a mother gives birth, each child brings new challenges and dynamics to the family. It is not unusual to have feelings of excitement along with confusing shifts in moods, emotions, and thoughts. All mothers are at risk of developing postpartum depression or the "baby blues." These mood changes can occur right after giving birth, or they may occur many months after giving birth. The baby blues or postpartum depression can be mild or severe. Additionally, postpartum depression can go away rather quickly, or it can be a long-term condition. What are the causes? Raised hormone levels and the rapid drop in those levels are thought to be a main cause of postpartum depression and the baby blues. A number of hormones change during and after pregnancy. Estrogen and progesterone usually decrease right after the delivery of your baby. The levels of thyroid hormone and various cortisol steroids also rapidly drop. Other factors that play a role in these mood changes include major life events and genetics. What increases the risk? If you have any of the following risks for the baby blues or postpartum depression, know what symptoms to watch out for during the postpartum period. Risk factors that may increase the likelihood of getting the baby blues or postpartum depression include:  Having a personal or family history of depression.  Having depression while being pregnant.  Having premenstrual mood issues or mood issues related to oral contraceptives.  Having a lot of life stress.  Having marital conflict.  Lacking a social support network.  Having a baby with special needs.  Having health problems, such as diabetes.  What are the signs or  symptoms? Symptoms of baby blues include:  Brief changes in mood, such as going from extreme happiness to sadness.  Decreased concentration.  Difficulty sleeping.  Crying spells, tearfulness.  Irritability.  Anxiety.  Symptoms of postpartum depression typically begin within the first month after giving birth. These symptoms include:  Difficulty sleeping or excessive sleepiness.  Marked weight loss.  Agitation.  Feelings of worthlessness.  Lack of interest in activity or food.  Postpartum psychosis is a very serious condition and can be dangerous. Fortunately, it is rare. Displaying any of the following symptoms is cause for immediate medical attention. Symptoms of postpartum psychosis include:  Hallucinations and delusions.  Bizarre or disorganized behavior.  Confusion or disorientation.  How is this diagnosed? A diagnosis is made by an evaluation of your symptoms. There are no medical or lab tests that lead to a diagnosis, but there are various questionnaires that a health care provider may use to identify those with the baby blues, postpartum depression, or psychosis. Often, a screening tool called the Edinburgh Postnatal Depression Scale is used to diagnose depression in the postpartum period. How is this treated? The baby blues usually goes away on its own in 1-2 weeks. Social support is often all that is needed. You will be encouraged to get adequate sleep and rest. Occasionally, you may be given medicines to help you sleep. Postpartum depression requires treatment because it can last several months or longer if it is not treated. Treatment may include individual or group therapy, medicine, or both to address any social, physiological, and psychological factors that may play a role in the   depression. Regular exercise, a healthy diet, rest, and social support may also be strongly recommended. Postpartum psychosis is more serious and needs treatment right away.  Hospitalization is often needed. Follow these instructions at home:  Get as much rest as you can. Nap when the baby sleeps.  Exercise regularly. Some women find yoga and walking to be beneficial.  Eat a balanced and nourishing diet.  Do little things that you enjoy. Have a cup of tea, take a bubble bath, read your favorite magazine, or listen to your favorite music.  Avoid alcohol.  Ask for help with household chores, cooking, grocery shopping, or running errands as needed. Do not try to do everything.  Talk to people close to you about how you are feeling. Get support from your partner, family members, friends, or other new moms.  Try to stay positive in how you think. Think about the things you are grateful for.  Do not spend a lot of time alone.  Only take over-the-counter or prescription medicine as directed by your health care provider.  Keep all your postpartum appointments.  Let your health care provider know if you have any concerns. Contact a health care provider if: You are having a reaction to or problems with your medicine. Get help right away if:  You have suicidal feelings.  You think you may harm the baby or someone else. This information is not intended to replace advice given to you by your health care provider. Make sure you discuss any questions you have with your health care provider. Document Released: 04/21/2004 Document Revised: 12/24/2015 Document Reviewed: 04/29/2013 Elsevier Interactive Patient Education  2017 [employer]  

## 2016-12-28 ENCOUNTER — Encounter: Payer: Self-pay | Admitting: *Deleted

## 2017-08-03 IMAGING — US US MFM OB FOLLOW-UP
1 series · 14 of 28 positions shown · non-contrast
Comparison: none

[Series 1: us mfm ob follow-up · 14 of 51 slices shown]
[im 2/51]
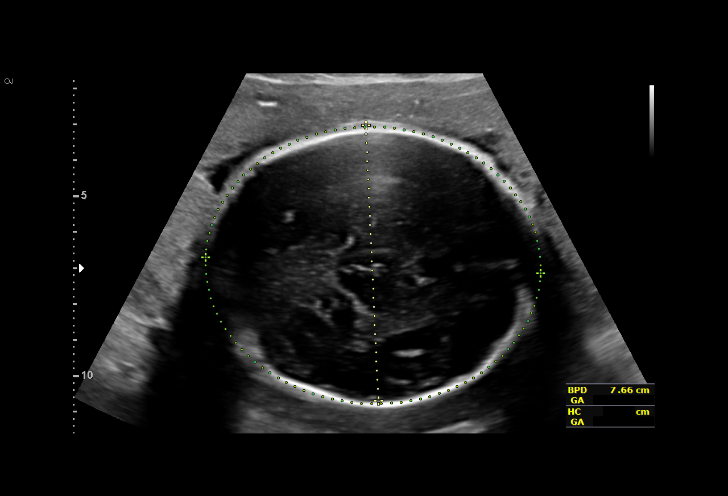
[im 6/51]
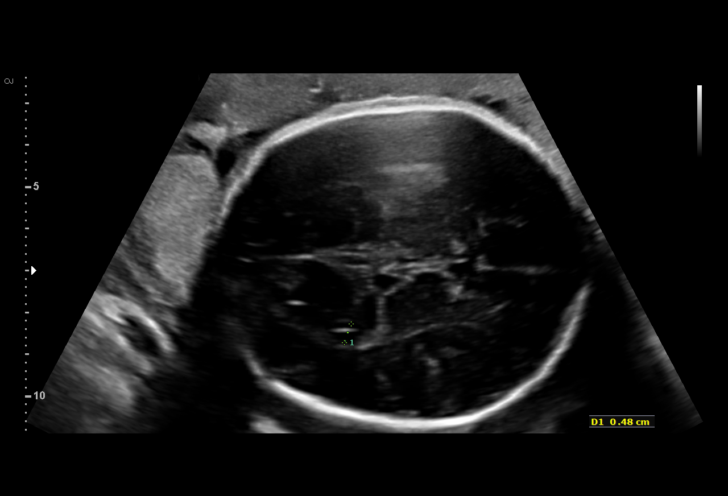
[im 10/51]
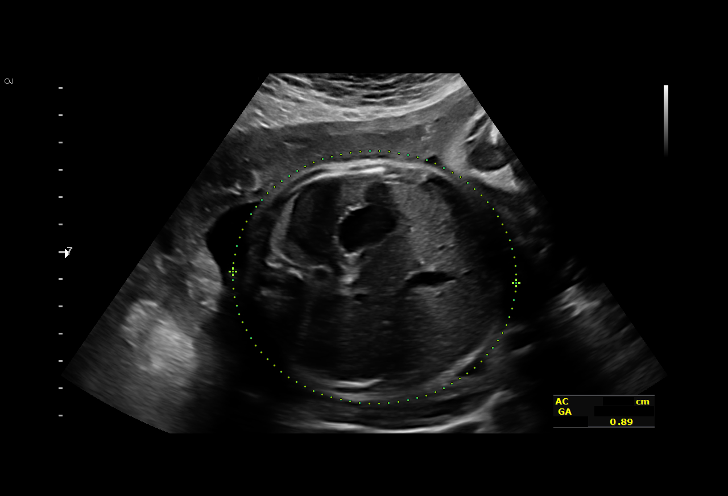
[im 13/51]
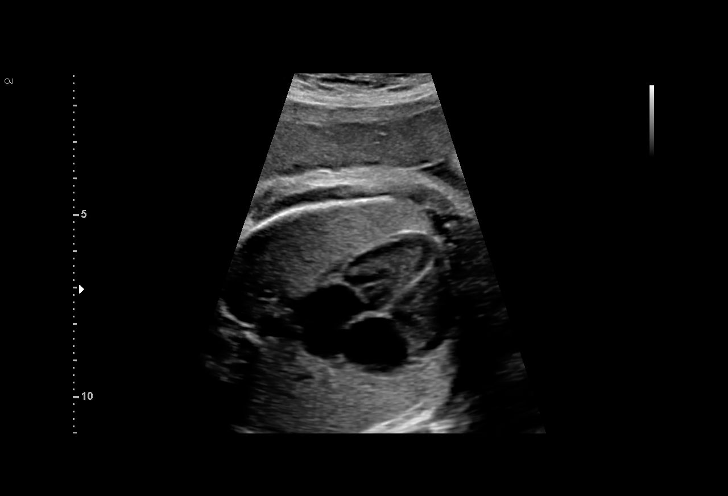
[im 17/51]
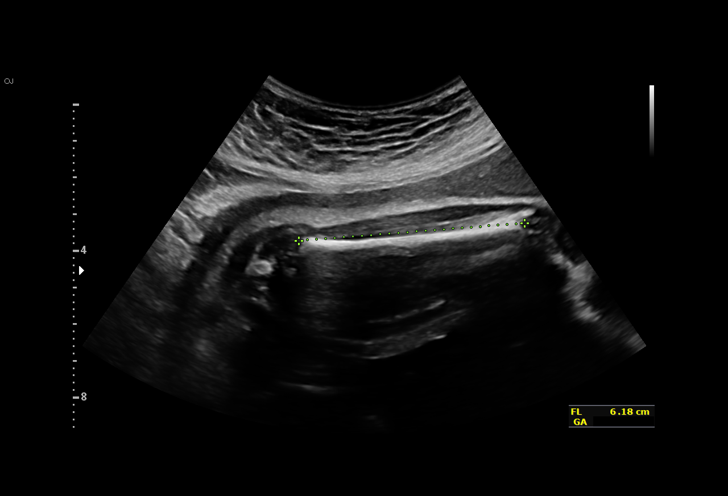
[im 21/51]
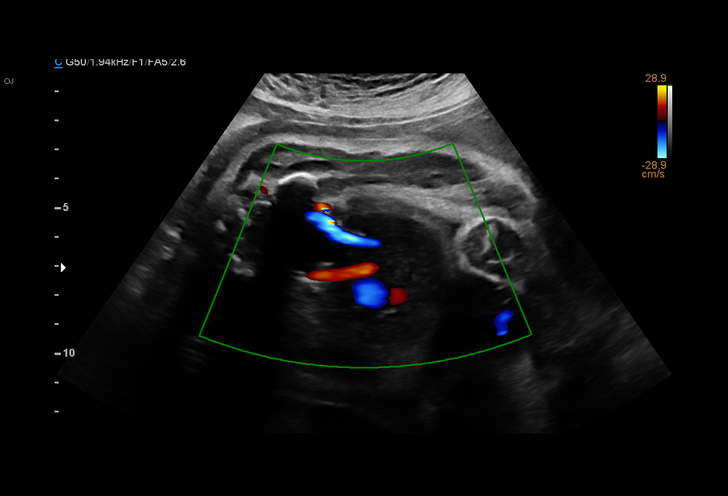
[im 25/51]
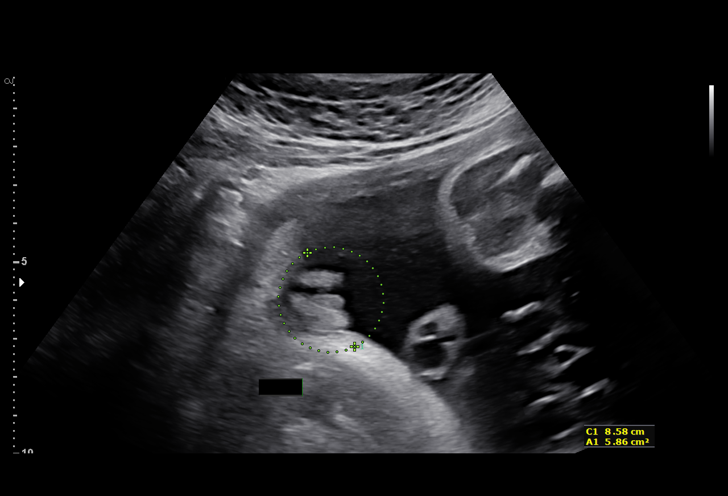
[im 28/51]
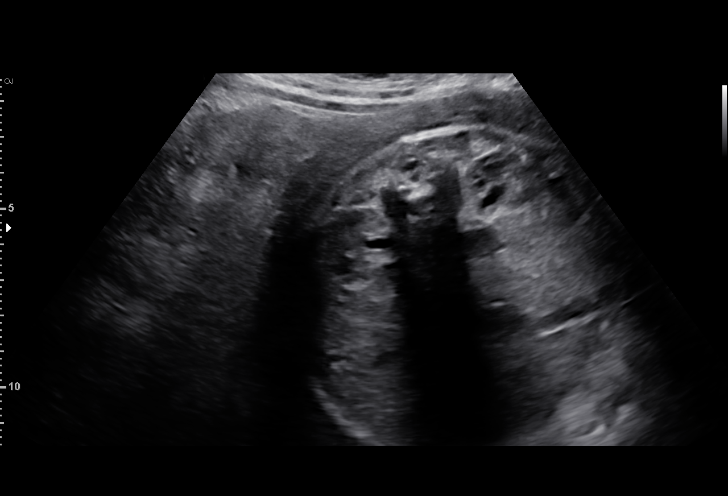
[im 32/51]
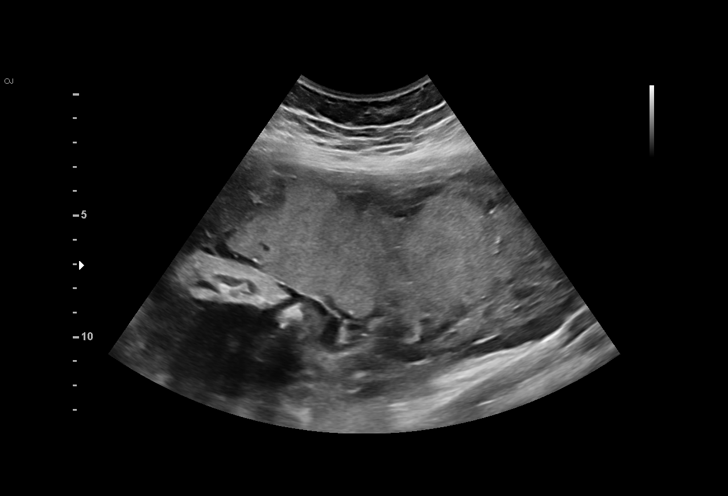
[im 36/51]
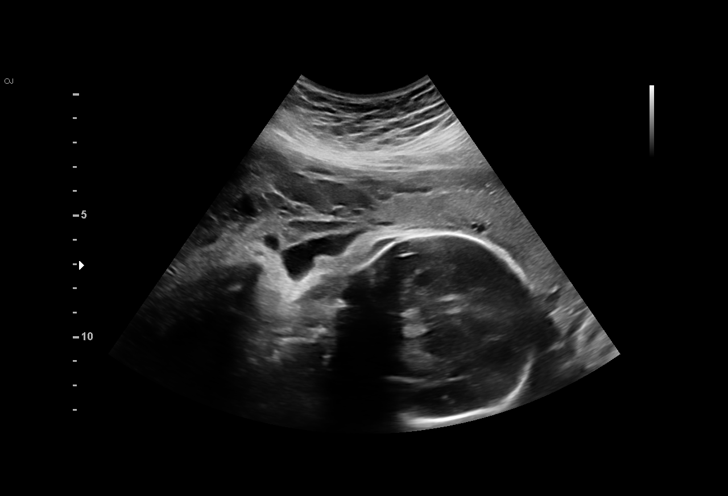
[im 39/51]
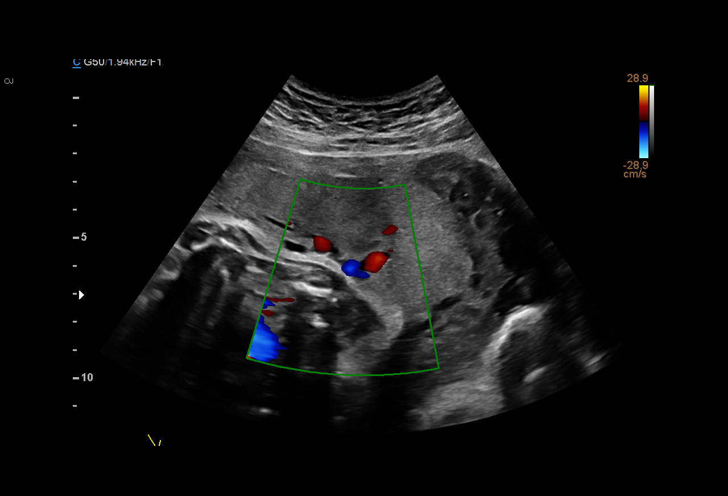
[im 43/51]
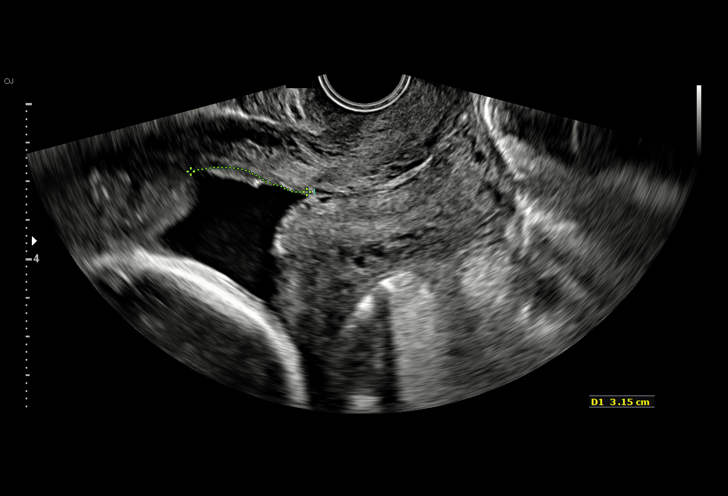
[im 47/51]
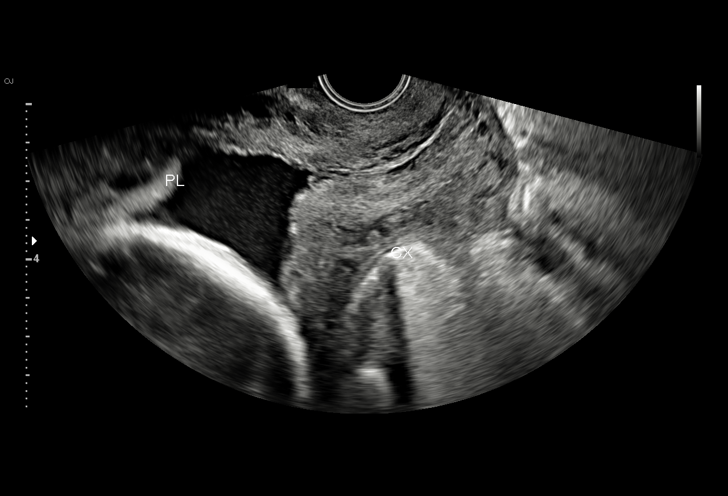
[im 51/51]
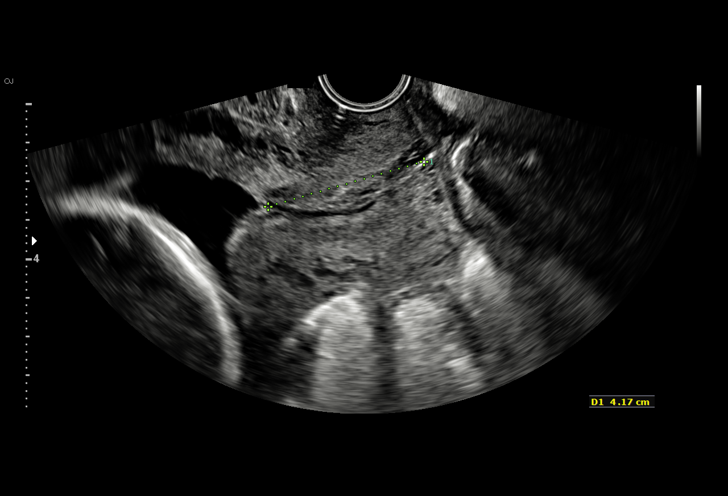

[14 of 28 positions shown; findings below may reference images not displayed]

1  JIANLE CORRADI          [id number]      [id number]     [id number]
2  JIANLE CORRADI          [id number]      [id number]     [id number]
Indications

32 weeks gestation of pregnancy
Low lying placenta, antepartum
Encounter for other antenatal screening
follow-up
OB History

Blood Type:            Height:  5'2"   Weight (lb):  190      BMI:
Gravidity:    6         Term:   2        Prem:   0        SAB:   3
TOP:          0       Ectopic:  0        Living: 2
Fetal Evaluation

Num Of Fetuses:     1
Fetal Heart         134
Rate(bpm):
Cardiac Activity:   Observed
Presentation:       Cephalic
Placenta:           Anterior, above cervical os
P. Cord Insertion:  Previously Visualized

Amniotic Fluid
AFI FV:      Subjectively within normal limits

AFI Sum(cm)     %Tile       Largest Pocket(cm)
9.99            17

RUQ(cm)                     LUQ(cm)        LLQ(cm)
4.69
Biometry

BPD:      76.2  mm     G. Age:  30w 4d          4  %    CI:        78.01   %   70 - 86
FL/HC:      22.7   %   19.1 -
HC:       273   mm     G. Age:  29w 6d        < 3  %    HC/AC:      0.87       0.96 -
AC:      315.6  mm     G. Age:  35w 3d       > 97  %    FL/BPD:     81.2   %   71 - 87
FL:       61.9  mm     G. Age:  32w 0d         29  %    FL/AC:      19.6   %   20 - 24
HUM:      55.8  mm     G. Age:  32w 3d         54  %

Est. FW:    2966  gm    4 lb 14 oz      76  %
Gestational Age

LMP:           32w 0d       Date:   01/22/16                 EDD:   10/28/16
U/S Today:     32w 0d                                        EDD:   10/28/16
Best:          32w 3d    Det. By:   U/S  (06/09/16)          EDD:   10/25/16
Anatomy

Cranium:               Appears normal         Aortic Arch:            Previously seen
Cavum:                 Appears normal         Ductal Arch:            Previously seen
Ventricles:            Appears normal         Diaphragm:              Appears normal
Choroid Plexus:        Previously seen        Stomach:                Appears normal, left
sided
Cerebellum:            Previously seen        Abdomen:                Appears normal
Posterior Fossa:       Previously seen        Abdominal Wall:         Previously seen
Nuchal Fold:           Not applicable (>20    Cord Vessels:           Appears normal (3
wks GA)                                        vessel cord)
Face:                  Orbits and profile     Kidneys:                Appear normal
previously seen
Lips:                  Previously seen        Bladder:                Appears normal
Thoracic:              Appears normal         Spine:                  Limited views
previously seen
Heart:                 Appears normal         Upper Extremities:      Previously seen
(4CH, axis, and situs
RVOT:                  Previously seen        Lower Extremities:      Previously seen
LVOT:                  Previously seen

Other:  Fetus appears to be a female. Nasal bone visualized.
Cervix Uterus Adnexa

Cervix
Length:            4.2  cm.
Normal appearance by transvaginal scan

Uterus
No abnormality visualized.

Left Ovary
Not visualized. No adnexal mass visualized.

Right Ovary
Not visualized. No adnexal mass visualized.
Impression

SIUP at 32+3 weeks
Normal interval anatomy; anatomic survey complete
Normal amniotic fluid volume
Appropriate interval growth with EFW at the 76th %tile; AC >
97th %tile
EV views of cervix: normal length without funneling; no previa
Anterior placenta located above cervical os
Recommendations

Follow-up as clinically indicated

## 2018-02-09 ENCOUNTER — Other Ambulatory Visit: Payer: Self-pay

## 2018-02-09 ENCOUNTER — Encounter: Payer: Self-pay | Admitting: Emergency Medicine

## 2018-02-09 ENCOUNTER — Emergency Department
Admission: EM | Admit: 2018-02-09 | Discharge: 2018-02-09 | Disposition: A | Discharge status: Home / Self Care | Payer: Managed Care, Other (non HMO) | Attending: Emergency Medicine | Admitting: Emergency Medicine

## 2018-02-09 DIAGNOSIS — Y93H2 Activity, gardening and landscaping: Secondary | ICD-10-CM | POA: Insufficient documentation

## 2018-02-09 DIAGNOSIS — R21 Rash and other nonspecific skin eruption: Secondary | ICD-10-CM | POA: Diagnosis present

## 2018-02-09 DIAGNOSIS — S60462A Insect bite (nonvenomous) of right middle finger, initial encounter: Secondary | ICD-10-CM | POA: Insufficient documentation

## 2018-02-09 DIAGNOSIS — Y999 Unspecified external cause status: Secondary | ICD-10-CM | POA: Insufficient documentation

## 2018-02-09 DIAGNOSIS — L0100 Impetigo, unspecified: Secondary | ICD-10-CM | POA: Diagnosis not present

## 2018-02-09 DIAGNOSIS — W57XXXA Bitten or stung by nonvenomous insect and other nonvenomous arthropods, initial encounter: Secondary | ICD-10-CM | POA: Insufficient documentation

## 2018-02-09 DIAGNOSIS — Y929 Unspecified place or not applicable: Secondary | ICD-10-CM | POA: Insufficient documentation

## 2018-02-09 MED ORDER — CEPHALEXIN 500 MG PO CAPS: 500.0000 mg | ORAL_CAPSULE | Freq: Four times a day (QID) | ORAL | 0 refills | Status: AC

## 2018-02-09 MED ORDER — CEPHALEXIN 500 MG PO CAPS
500.0000 mg | ORAL_CAPSULE | Freq: Once | ORAL | Status: AC
  Administered 2018-02-09: 500 mg via ORAL
  Filled 2018-02-09: qty 1

## 2018-02-09 NOTE — ED Notes (Signed)
ED Provider at bedside. 

## 2018-02-09 NOTE — ED Triage Notes (Signed)
Pt arrives ambulatory to triage with an insect bite x 2 weeks ago on her right middle finger. Area is swollen and red at this time and pt reports DC from site. Pt is in NAD.

## 2018-02-09 NOTE — Discharge Instructions (Addendum)
Please follow up with the acute care clinic for further evaluation.  °

## 2018-02-10 NOTE — ED Provider Notes (Signed)
[facility] Emergency Department Provider Note   ____________________________________________   First MD Initiated Contact with Patient 02/09/18 2319     (approximate)  I have reviewed the triage vital signs and the nursing notes.   HISTORY  Chief Complaint Insect Bite    HPI Sherri Brady is a 32 y.o. female who comes into the hospital today thinking that her finger is infected.  The patient reports that she was pulling some crab grass in her yard 2 weeks ago and thought maybe she was bitten by a bug.  She reports that her right middle finger started to itch.  The skin then became raw.  Last week it became red and weepy.  She put a Band-Aid on it and thinks that that might of made it worse.  The patient reports that it is not very painful but it hurts a little when she moves it.  She denies any fevers nausea or vomiting.  She is here today for evaluation and treatment of her symptoms.   Past Medical History:  Diagnosis Date  . Medical history non-contributory     There are no active problems to display for this patient.   Past Surgical History:  Procedure Laterality Date  . DILATION AND CURETTAGE OF UTERUS      Prior to Admission medications   Medication Sig Start Date End Date Taking? Authorizing Provider  cephALEXin (KEFLEX) 500 MG capsule Take 1 capsule (500 mg total) by mouth 4 (four) times daily for 10 days. 02/09/18 02/19/18  Rebecka ApleyWebster, Allison P, MD  TRINESSA, 28, 0.18/0.215/0.25 MG-35 MCG tablet Take 1 tablet by mouth daily. 12/27/16   Reva BoresPratt, Tanya S, MD    Allergies Patient has no known allergies.  Family History  Problem Relation Age of Onset  . Alcohol abuse Neg Hx   . Arthritis Neg Hx   . Asthma Neg Hx   . Birth defects Neg Hx   . Cancer Neg Hx   . COPD Neg Hx   . Depression Neg Hx   . Diabetes Neg Hx   . Drug abuse Neg Hx   . Early death Neg Hx   . Hearing loss Neg Hx   . Heart disease Neg Hx   . Hyperlipidemia Neg Hx     . Hypertension Neg Hx   . Kidney disease Neg Hx   . Learning disabilities Neg Hx   . Mental illness Neg Hx   . Mental retardation Neg Hx   . Miscarriages / Stillbirths Neg Hx   . Stroke Neg Hx   . Vision loss Neg Hx   . Varicose Veins Neg Hx     Social History Social History   Tobacco Use  . Smoking status: Current Every Day Smoker    Packs/day: 0.50  . Smokeless tobacco: Never Used  Substance Use Topics  . Alcohol use: No  . Drug use: No    Review of Systems  Constitutional: No fever/chills Eyes: No visual changes. ENT: No sore throat. Cardiovascular: Denies chest pain. Respiratory: Denies shortness of breath. Gastrointestinal: No abdominal pain.  No nausea, no vomiting.  No diarrhea.  No constipation. Genitourinary: Negative for dysuria. Musculoskeletal: Negative for back pain. Skin: Redness and yellowish dried drainage to right middle finger distally Neurological: Negative for headaches, focal weakness or numbness.   ____________________________________________   PHYSICAL EXAM:  VITAL SIGNS: ED Triage Vitals  Enc Vitals Group     BP 02/09/18 2210 (!) 143/81     Pulse Rate 02/09/18  2210 75     Resp 02/09/18 2210 18     Temp 02/09/18 2210 98.6 F (37 C)     Temp Source 02/09/18 2210 Oral     SpO2 02/09/18 2210 97 %     Weight 02/09/18 2208 180 lb (81.6 kg)     Height 02/09/18 2208 5\' 2"  (1.575 m)     Head Circumference --      Peak Flow --      Pain Score 02/09/18 2207 0     Pain Loc --      Pain Edu? --      Excl. in GC? --     Constitutional: Alert and oriented. Well appearing and in mild distress. Eyes: Conjunctivae are normal. PERRL. EOMI. Head: Atraumatic. Nose: No congestion/rhinnorhea. Mouth/Throat: Mucous membranes are moist.  Oropharynx non-erythematous. Cardiovascular: Normal rate, regular rhythm. Grossly normal heart sounds.  Good peripheral circulation. Respiratory: Normal respiratory effort.  No retractions. Lungs  CTAB. Gastrointestinal: Soft and nontender. No distention.  Positive bowel sounds Musculoskeletal: No lower extremity tenderness nor edema.   Neurologic:  Normal speech and language.  Skin:  Skin is warm, dry redness over the DIP joint of the second middle finger.  There is also some areas of honey colored crusting. Psychiatric: Mood and affect are normal.   ____________________________________________   LABS (all labs ordered are listed, but only abnormal results are displayed)  Labs Reviewed - No data to display ____________________________________________  EKG  none ____________________________________________  RADIOLOGY  ED MD interpretation:  none  Official radiology report(s): No results found.  ____________________________________________   PROCEDURES  Procedure(s) performed: None  Procedures  Critical Care performed: No  ____________________________________________   INITIAL IMPRESSION / ASSESSMENT AND PLAN / ED COURSE  As part of my medical decision making, I reviewed the following data within the electronic MEDICAL RECORD NUMBER Notes from prior ED visits and  Controlled Substance Database   This is a 32 year old female who comes into the hospital today with some redness to her finger that started a week after she pulled some grass.  The patient thought that it might have been due to poison ivy but she is not having any pain any blistering or any significant itching.  Looking at the patient's wound it does have the appearance of impetigo with honey colored crusting over the redness.  The patient does not appear to have vesicles such as with herpetic whitlow and she does not have a paronychia.  I will give the patient a dose of Keflex and she will be discharged home with Keflex.      ____________________________________________   FINAL CLINICAL IMPRESSION(S) / ED DIAGNOSES  Final diagnoses:  Insect bite of right middle finger, initial encounter   Impetigo     ED Discharge Orders        Ordered    cephALEXin (KEFLEX) 500 MG capsule  4 times daily     02/09/18 2338       Note:  This document was prepared using Dragon voice recognition software and may include unintentional dictation errors.    Rebecka Apley, MD 02/10/18 [phone]

## 2018-02-10 NOTE — ED Notes (Signed)
Reviewed discharge instructions, follow-up care, and prescriptions with patient. Patient verbalized understanding of all information reviewed. Patient stable, with no distress noted at this time.    

## 2018-02-26 ENCOUNTER — Other Ambulatory Visit: Payer: Self-pay

## 2018-02-26 DIAGNOSIS — Z3041 Encounter for surveillance of contraceptive pills: Secondary | ICD-10-CM

## 2018-02-26 MED ORDER — TRINESSA (28) 0.18/0.215/0.25 MG-35 MCG PO TABS: 1.0000 | ORAL_TABLET | Freq: Every day | ORAL | 5 refills | Status: DC

## 2018-04-04 ENCOUNTER — Emergency Department
Admission: EM | Admit: 2018-04-04 | Discharge: 2018-04-04 | Disposition: A | Discharge status: Home / Self Care | Payer: Managed Care, Other (non HMO) | Attending: Emergency Medicine | Admitting: Emergency Medicine

## 2018-04-04 ENCOUNTER — Other Ambulatory Visit: Payer: Self-pay

## 2018-04-04 ENCOUNTER — Emergency Department: Payer: Managed Care, Other (non HMO)

## 2018-04-04 DIAGNOSIS — R0602 Shortness of breath: Secondary | ICD-10-CM | POA: Insufficient documentation

## 2018-04-04 DIAGNOSIS — F17210 Nicotine dependence, cigarettes, uncomplicated: Secondary | ICD-10-CM | POA: Insufficient documentation

## 2018-04-04 LAB — CBC WITH DIFFERENTIAL/PLATELET
Basophils Absolute: 0.1 10*3/uL (ref 0–0.1)
Basophils Relative: 1 %
EOS ABS: 0.3 10*3/uL (ref 0–0.7)
EOS PCT: 3 %
HCT: 38.6 % (ref 35.0–47.0)
Hemoglobin: 13 g/dL (ref 12.0–16.0)
LYMPHS PCT: 19 %
Lymphs Abs: 2.3 10*3/uL (ref 1.0–3.6)
MCH: 27.8 pg (ref 26.0–34.0)
MCHC: 33.6 g/dL (ref 32.0–36.0)
MCV: 82.6 fL (ref 80.0–100.0)
MONO ABS: 0.8 10*3/uL (ref 0.2–0.9)
Monocytes Relative: 7 %
Neutro Abs: 8.2 10*3/uL — ABNORMAL HIGH (ref 1.4–6.5)
Neutrophils Relative %: 70 %
PLATELETS: 322 10*3/uL (ref 150–440)
RBC: 4.68 MIL/uL (ref 3.80–5.20)
RDW: 13.7 % (ref 11.5–14.5)
WBC: 11.7 10*3/uL — AB (ref 3.6–11.0)

## 2018-04-04 LAB — BASIC METABOLIC PANEL
ANION GAP: 7 (ref 5–15)
BUN: 10 mg/dL (ref 6–20)
CALCIUM: 8.8 mg/dL — AB (ref 8.9–10.3)
CO2: 28 mmol/L (ref 22–32)
Chloride: 102 mmol/L (ref 98–111)
Creatinine, Ser: 0.61 mg/dL (ref 0.44–1.00)
GFR calc Af Amer: >60 mL/min (ref >60)
GLUCOSE: 108 mg/dL — AB (ref 70–99)
POTASSIUM: 4.1 mmol/L (ref 3.5–5.1)
SODIUM: 137 mmol/L (ref 135–145)

## 2018-04-04 LAB — TROPONIN I

## 2018-04-04 LAB — FIBRIN DERIVATIVES D-DIMER (ARMC ONLY): Fibrin derivatives D-dimer (ARMC): 165.28 ng/mL (FEU) (ref 0.00–499.00)

## 2018-04-04 MED ORDER — ALBUTEROL SULFATE HFA 108 (90 BASE) MCG/ACT IN AERS: 2.0000 | INHALATION_SPRAY | Freq: Four times a day (QID) | RESPIRATORY_TRACT | 2 refills | Status: DC | PRN

## 2018-04-04 NOTE — ED Provider Notes (Signed)
[facility] Emergency Department Provider Note       Time seen: ----------------------------------------- 8:37 AM on 04/04/2018 -----------------------------------------   I have reviewed the triage vital signs and the nursing notes.  HISTORY   Chief Complaint Shortness of Breath    HPI Sherri Brady is a 32 y.o. female with no significant past medical history who presents to the ED for shortness of breath.  Patient complains of a burning pain in her lungs like when she is running and cannot get her breath.  She denies fevers, chills, cough, recently has had some congestion which she thought was seasonal allergy related.  She denies any history of this.  Past Medical History:  Diagnosis Date  . Medical history non-contributory     There are no active problems to display for this patient.   Past Surgical History:  Procedure Laterality Date  . DILATION AND CURETTAGE OF UTERUS      Allergies Patient has no known allergies.  Social History Social History   Tobacco Use  . Smoking status: Current Every Day Smoker    Packs/day: 0.50  . Smokeless tobacco: Never Used  Substance Use Topics  . Alcohol use: No  . Drug use: No   Review of Systems Constitutional: Negative for fever. Cardiovascular: Positive for chest pain Respiratory: Positive for burning pain with breathing Gastrointestinal: Negative for abdominal pain, vomiting and diarrhea. Musculoskeletal: Negative for back pain. Skin: Negative for rash. Neurological: Negative for headaches, focal weakness or numbness.  All systems negative/normal/unremarkable except as stated in the HPI  ____________________________________________   PHYSICAL EXAM:  VITAL SIGNS: ED Triage Vitals  Enc Vitals Group     BP 04/04/18 0825 (!) 152/98     Pulse Rate 04/04/18 0825 61     Resp 04/04/18 0825 18     Temp 04/04/18 0825 98 F (36.7 C)     Temp Source 04/04/18 0825 Oral     SpO2 04/04/18  0825 98 %     Weight 04/04/18 0826 180 lb (81.6 kg)     Height 04/04/18 0826 5\' 2"  (1.575 m)     Head Circumference --      Peak Flow --      Pain Score 04/04/18 0825 6     Pain Loc --      Pain Edu? --      Excl. in GC? --    Constitutional: Alert and oriented. Well appearing and in no distress. Eyes: Conjunctivae are normal. Normal extraocular movements. ENT   Head: Normocephalic and atraumatic.   Nose: No congestion/rhinnorhea.   Mouth/Throat: Mucous membranes are moist.   Neck: No stridor. Cardiovascular: Normal rate, regular rhythm. No murmurs, rubs, or gallops. Respiratory: Normal respiratory effort without tachypnea nor retractions. Breath sounds are clear and equal bilaterally. No wheezes/rales/rhonchi. Gastrointestinal: Soft and nontender. Normal bowel sounds Musculoskeletal: Nontender with normal range of motion in extremities. No lower extremity tenderness nor edema. Neurologic:  Normal speech and language. No gross focal neurologic deficits are appreciated.  Skin:  Skin is warm, dry and intact. No rash noted. Psychiatric: Mood and affect are normal. Speech and behavior are normal.  ____________________________________________  EKG: Interpreted by me.  Sinus bradycardia rate 54 bpm, normal PR interval, normal QRS, normal QT  ____________________________________________  ED COURSE:  As part of my medical decision making, I reviewed the following data within the electronic MEDICAL RECORD NUMBER History obtained from family if available, nursing notes, old chart and ekg, as well as notes from prior  ED visits. Patient presented for burning chest pain with breathing, we will assess with labs and imaging as indicated at this time.   Procedures ____________________________________________   LABS (pertinent positives/negatives)  Labs Reviewed  CBC WITH DIFFERENTIAL/PLATELET - Abnormal; Notable for the following components:      Result Value   WBC 11.7 (*)     Neutro Abs 8.2 (*)    All other components within normal limits  BASIC METABOLIC PANEL - Abnormal; Notable for the following components:   Glucose, Bld 108 (*)    Calcium 8.8 (*)    All other components within normal limits  TROPONIN I  FIBRIN DERIVATIVES D-DIMER ([facility] ONLY)    RADIOLOGY Images were viewed by me  Chest x-ray is normal  ____________________________________________  DIFFERENTIAL DIAGNOSIS   Bronchospasm, pleuritic pain, seasonal allergy, muscle strain, anxiety, GERD  FINAL ASSESSMENT AND PLAN  Chest pain   Plan: The patient had presented for chest pain with breathing. Patient's labs do not reveal any acute process. Patient's imaging was negative.  No clear etiology for her symptoms.  She is cleared for outpatient follow-up.   Ulice Dash, MD   Note: This note was generated in part or whole with voice recognition software. Voice recognition is usually quite accurate but there are transcription errors that can and very often do occur. I apologize for any typographical errors that were not detected and corrected.     Emily Filbert, MD 04/04/18 1026

## 2018-04-04 NOTE — ED Notes (Signed)
Patient transported to X-ray 

## 2018-04-04 NOTE — ED Notes (Signed)
Pt ambulatory to POV without difficulty. VSS. NAD. Discharge instructions, RX and follow up reviewed. All questions and concerns addressed.  

## 2018-04-04 NOTE — ED Notes (Signed)
First Nurse Note: Patient states her chest hurts when she breaths.  Denies recent URI.  Alert and oriented, NAD.

## 2018-04-04 NOTE — ED Triage Notes (Addendum)
Pt c/o burning lung pain "like when you are running and cant get your breath" pt is in NAD. Respirations WNL, pt speaking in complete sentences. Denies cough or congestion. Pt points to the epigastric region where pain is located.

## 2018-05-11 LAB — HM PAP SMEAR: HM Pap smear: NEGATIVE

## 2019-06-24 ENCOUNTER — Telehealth: Payer: Self-pay | Admitting: Family Medicine

## 2019-06-24 NOTE — Telephone Encounter (Signed)
TC to patient. Needs OCP refill.  Provider appt scheduled for 07/02/19 Aileen Fass, RN

## 2019-06-24 NOTE — Telephone Encounter (Signed)
PATIENT WOULD LIKE TO SPEAK TO PROVIDER

## 2019-06-26 ENCOUNTER — Encounter: Payer: Self-pay | Admitting: Physician Assistant

## 2019-06-26 NOTE — Progress Notes (Signed)
Received refill request for patient for her OCs, Ortho Tri Cyclen.  Form completed and faxed to [facility] for 2 refills of Ortho Tri Cyclen.  Fax confirmation received.

## 2019-07-01 DIAGNOSIS — F40298 Other specified phobia: Secondary | ICD-10-CM

## 2019-07-02 ENCOUNTER — Ambulatory Visit (LOCAL_COMMUNITY_HEALTH_CENTER): Payer: Medicaid Other | Admitting: Family Medicine

## 2019-07-02 ENCOUNTER — Other Ambulatory Visit: Payer: Self-pay

## 2019-07-02 ENCOUNTER — Encounter: Payer: Self-pay | Admitting: Family Medicine

## 2019-07-02 VITALS — BP 140/98 | Ht 62.0 in | Wt 170.0 lb

## 2019-07-02 DIAGNOSIS — Z30011 Encounter for initial prescription of contraceptive pills: Secondary | ICD-10-CM | POA: Diagnosis not present

## 2019-07-02 DIAGNOSIS — Z3009 Encounter for other general counseling and advice on contraception: Secondary | ICD-10-CM | POA: Diagnosis not present

## 2019-07-02 DIAGNOSIS — Z3041 Encounter for surveillance of contraceptive pills: Secondary | ICD-10-CM

## 2019-07-02 MED ORDER — NORGESTIM-ETH ESTRAD TRIPHASIC 0.18/0.215/0.25 MG-35 MCG PO TABS: 1.0000 | ORAL_TABLET | Freq: Every day | ORAL | 0 refills | Status: DC

## 2019-07-02 NOTE — Progress Notes (Signed)
Pt here for more birth control pills. Pt's BP is 140/98. Pt denies any headaches, chest pain or other symptoms at this time.Ronny Bacon, RN

## 2019-07-02 NOTE — Progress Notes (Signed)
BP recheck 123/88 and Hassell Done, FNP made aware. Pt aware that provider will e-prescribe OCP's to pt's pharmacy on file. Counseled pt per provider orders and pt states understanding. Provider orders completed.Ronny Bacon, RN

## 2019-07-02 NOTE — Progress Notes (Signed)
   [place] problem visit  [place] Department  Subjective:  Sherri Brady is a 33 y.o. being seen today for   Chief Complaint  Patient presents with  . Contraception    here for more OCP's    HPI  Client is here for refill on Trinessa.  She voices  no complaints or concerns today.   Does the patient have a current or past history of drug use? No   No components found for: HCV]   Health Maintenance Due  Topic Date Due  . INFLUENZA VACCINE  03/02/2019    ROS  The following portions of the patient's history were reviewed and updated as appropriate: allergies, current medications, past family history, past medical history, past social history, past surgical history and problem list. Problem list updated.   See flowsheet for other program required questions.  Objective:   Vitals:   07/02/19 0825  BP: (!) 140/98  Weight: 170 lb (77.1 kg)  Height: 5\' 2"  (1.575 m)    Physical Exam not indicated    Assessment and Plan:  Sherri Brady is a 33 y.o. female presenting to the [facility] Department for a Women's Health problem visit . 1. Surveillance of previously prescribed contraceptive pill  - Norgestimate-Ethinyl Estradiol Triphasic 0.18/0.215/0.25 MG-35 MCG tablet; Take 1 tablet by mouth daily.  Dispense: 2 Package; Refill: 0  2. General counseling and advice on contraceptive management Please recheck BP Needs annual visit.  Co client to schedule appt. In 08/2019.  Pap due 2024   No follow-ups on file.  No future appointments.  Hassell Done, FNP

## 2019-08-02 NOTE — L&D Delivery Note (Signed)
Vaginal Delivery Note  Nursing delivered infant as I was entering the room. Spontaneous delivery of live viable female infant from the vertex position through an intact perineum.    Infant placed on maternal chest. Nursery present and helped with neonatal resuscitation and evaluation. Cord clamped and cut after one minute. Cord blood collected. Placenta delivered spontaneously and intact with a 3 vessel cord.  No lacerations. Patient had bright red bleeding with delivery of the placenta. Fundal massage, IV pitocin and IM Hemabate given. Bleeding resolved. Uterus firm and below umbilicus at the end of the delivery.  Mom and baby recovering in stable condition. Sponge and needle counts were correct at the end of the delivery.  APGARS: 1 minute:7 5 minutes: 9 Weight: pending  Adelene Idler MD [facility], [facility] 03/26/20 3:42 PM

## 2019-09-02 ENCOUNTER — Other Ambulatory Visit: Payer: Self-pay

## 2019-09-02 ENCOUNTER — Ambulatory Visit (LOCAL_COMMUNITY_HEALTH_CENTER): Payer: Self-pay

## 2019-09-02 VITALS — BP 115/75 | Ht 62.0 in | Wt 180.5 lb

## 2019-09-02 DIAGNOSIS — Z3201 Encounter for pregnancy test, result positive: Secondary | ICD-10-CM

## 2019-09-02 LAB — PREGNANCY, URINE: Preg Test, Ur: POSITIVE — AB

## 2019-09-02 MED ORDER — PRENATAL VITAMIN 27-0.8 MG PO TABS: 1.0000 | ORAL_TABLET | Freq: Every day | ORAL | 0 refills | Status: DC

## 2019-09-02 NOTE — Progress Notes (Signed)
Pt unsure of prenatal caregiver, maybe a provider in [place].  Already applied for Medicaid; declines preadmit.

## 2019-09-27 ENCOUNTER — Other Ambulatory Visit (HOSPITAL_COMMUNITY)
Admission: RE | Admit: 2019-09-27 | Discharge: 2019-09-27 | Disposition: A | Discharge status: Home / Self Care | Payer: Medicaid Other | Source: Ambulatory Visit | Attending: Obstetrics & Gynecology | Admitting: Obstetrics & Gynecology

## 2019-09-27 ENCOUNTER — Encounter: Payer: Self-pay | Admitting: Obstetrics & Gynecology

## 2019-09-27 ENCOUNTER — Ambulatory Visit (INDEPENDENT_AMBULATORY_CARE_PROVIDER_SITE_OTHER): Payer: Medicaid Other | Admitting: Obstetrics & Gynecology

## 2019-09-27 ENCOUNTER — Other Ambulatory Visit: Payer: Self-pay

## 2019-09-27 VITALS — BP 102/70 | Wt 183.0 lb

## 2019-09-27 DIAGNOSIS — Z3490 Encounter for supervision of normal pregnancy, unspecified, unspecified trimester: Secondary | ICD-10-CM | POA: Diagnosis not present

## 2019-09-27 DIAGNOSIS — Z3A14 14 weeks gestation of pregnancy: Secondary | ICD-10-CM

## 2019-09-27 DIAGNOSIS — Z3482 Encounter for supervision of other normal pregnancy, second trimester: Secondary | ICD-10-CM

## 2019-09-27 DIAGNOSIS — Z3201 Encounter for pregnancy test, result positive: Secondary | ICD-10-CM

## 2019-09-27 LAB — OB RESULTS CONSOLE GC/CHLAMYDIA: Gonorrhea: NEGATIVE

## 2019-09-27 LAB — OB RESULTS CONSOLE VARICELLA ZOSTER ANTIBODY, IGG: Varicella: IMMUNE

## 2019-09-27 MED ORDER — PRENATAL VITAMIN 27-0.8 MG PO TABS: 1.0000 | ORAL_TABLET | Freq: Every day | ORAL | 5 refills | Status: AC

## 2019-09-27 NOTE — Patient Instructions (Signed)

## 2019-09-27 NOTE — Progress Notes (Signed)
09/27/2019   Chief Complaint: Missed period  Transfer of Care Patient: confirmed at [facility]   History of Present Illness: Ms. Haidar is a 34 y.o. [id number] [redacted]w[redacted]d based on Patient's last menstrual period was 06/16/2019. with an Estimated Date of Delivery: 03/22/20, with the above CC.   Her periods were: regular periods every 28 days She was using no method, was about to start OCPs, when she conceived.  She has Positive signs or symptoms of nausea/vomiting of pregnancy. She has Negative signs or symptoms of miscarriage or preterm labor She identifies Negative Zika risk factors for her and her partner On any different medications around the time she conceived/early pregnancy: No  History of varicella: Yes   ROS: A 12-point review of systems was performed and negative, except as stated in the above HPI.  OBGYN History: As per HPI. OB History  Gravida Para Term Preterm AB Living  7 3 3  0 3 3  SAB TAB Ectopic Multiple Live Births  3 0 0 0 3    # Outcome Date GA Lbr Len/2nd Weight Sex Delivery Anes PTL Lv  7 Current           6 Term 10/30/16 [redacted]w[redacted]d 04:25 / 00:05 8 lb 0.2 oz (3.634 kg) F Vag-Spont None  LIV  5 Term 2006     Vag-Spont   LIV  4 Term 2005     Vag-Spont   LIV  3 SAB           2 SAB           1 SAB             Obstetric Comments  G1: 03/2004, TSVD 8lbs, no issues; G2: 03/2005 TSVD, 8lbs; G3 07/2013 SAB D&C; G4 2015 SAB; G5 2017 SAB.  All SABs were early.     Any issues with any prior pregnancies: no Any prior children are healthy, doing well, without any problems or issues: yes History of pap smears: Yes. Last pap smear 2019. Abnormal: no  History of STIs: No   Past Medical History: Past Medical History:  Diagnosis Date  . Medical history non-contributory   . Patient denies medical problems     Past Surgical History: Past Surgical History:  Procedure Laterality Date  . DILATION AND CURETTAGE OF UTERUS      Family History:  Family History  Problem Relation  Age of Onset  . Cataracts Mother   . Arthritis Mother   . Alcohol abuse Neg Hx   . Asthma Neg Hx   . Birth defects Neg Hx   . Cancer Neg Hx   . COPD Neg Hx   . Depression Neg Hx   . Diabetes Neg Hx   . Drug abuse Neg Hx   . Early death Neg Hx   . Hearing loss Neg Hx   . Heart disease Neg Hx   . Hyperlipidemia Neg Hx   . Hypertension Neg Hx   . Kidney disease Neg Hx   . Learning disabilities Neg Hx   . Mental illness Neg Hx   . Mental retardation Neg Hx   . Miscarriages / Stillbirths Neg Hx   . Stroke Neg Hx   . Vision loss Neg Hx   . Varicose Veins Neg Hx    She denies any female cancers, bleeding or blood clotting disorders.  She denies any history of mental retardation, birth defects or genetic disorders in her or the FOB's history  Social History:  Social History  Socioeconomic History  . Marital status: Single    Spouse name: Not on file  . Number of children: Not on file  . Years of education: Not on file  . Highest education level: Not on file  Occupational History  . Not on file  Tobacco Use  . Smoking status: Current Every Day Smoker    Packs/day: 0.50  . Smokeless tobacco: Never Used  . Tobacco comment: pt has decreased smoking since finding out she is pregnant  Substance and Sexual Activity  . Alcohol use: Not Currently  . Drug use: Not Currently    Types: Cocaine  . Sexual activity: Yes    Birth control/protection: OCP  Other Topics Concern  . Not on file  Social History Narrative  . Not on file   Social Determinants of Health   Financial Resource Strain:   . Difficulty of Paying Living Expenses: Not on file  Food Insecurity:   . Worried About Charity fundraiser in the Last Year: Not on file  . Ran Out of Food in the Last Year: Not on file  Transportation Needs:   . Lack of Transportation (Medical): Not on file  . Lack of Transportation (Non-Medical): Not on file  Physical Activity:   . Days of Exercise per Week: Not on file  . Minutes  of Exercise per Session: Not on file  Stress:   . Feeling of Stress : Not on file  Social Connections:   . Frequency of Communication with Friends and Family: Not on file  . Frequency of Social Gatherings with Friends and Family: Not on file  . Attends Religious Services: Not on file  . Active Member of Clubs or Organizations: Not on file  . Attends Archivist Meetings: Not on file  . Marital Status: Not on file  Intimate Partner Violence:   . Fear of Current or Ex-Partner: Not on file  . Emotionally Abused: Not on file  . Physically Abused: Not on file  . Sexually Abused: Not on file   Any pets in the household: no  Allergy: No Known Allergies  Current Outpatient Medications:  Current Outpatient Medications:  .  Prenatal Vit-Fe Fumarate-FA (PRENATAL VITAMIN) 27-0.8 MG TABS, Take 1 tablet by mouth daily., Disp: 100 tablet, Rfl: 5   Physical Exam:   BP 102/70   Wt 183 lb (83 kg)   LMP 06/16/2019 Comment: normal period  BMI 33.47 kg/m  Body mass index is 33.47 kg/m. Constitutional: Well nourished, well developed female in no acute distress.  Neck:  Supple, normal appearance, and no thyromegaly  Cardiovascular: S1, S2 normal, no murmur, rub or gallop, regular rate and rhythm Respiratory:  Clear to auscultation bilateral. Normal respiratory effort Abdomen: positive bowel sounds and no masses, hernias; diffusely non tender to palpation, non distended Breasts: breasts appear normal, no suspicious masses, no skin or nipple changes or axillary nodes. Neuro/Psych:  Normal mood and affect.  Skin:  Warm and dry.  Lymphatic:  No inguinal lymphadenopathy.   Pelvic exam: is not limited by body habitus EGBUS: within normal limits, Vagina: within normal limits and with no blood in the vault, Cervix: normal appearing cervix without discharge or lesions, closed/long/high, Uterus:  enlarged: 12 weeks, and Adnexa:  normal adnexa  Assessment: Ms. Guevara is a 34 y.o. [id number]  [redacted]w[redacted]d based on Patient's last menstrual period was 06/16/2019. with an Estimated Date of Delivery: 03/22/20,  for prenatal care.  Plan:  1) Avoid alcoholic beverages. 2) Patient encouraged not to  smoke.  3) Discontinue the use of all non-medicinal drugs and chemicals.  4) Take prenatal vitamins daily.  5) Seatbelt use advised 6) Nutrition, food safety (fish, cheese advisories, and high nitrite foods) and exercise discussed. 7) Hospital and practice style delivering at [facility] discussed  8) Patient is asked about travel to areas at risk for the Zika virus, and counseled to avoid travel and exposure to mosquitoes or sexual partners who may have themselves been exposed to the virus. Testing is discussed, and will be ordered as appropriate.  9) Childbirth classes at [facility] advised 10) Genetic Screening, such as with 1st Trimester Screening, cell free fetal DNA, AFP testing, and Ultrasound, as well as with amniocentesis and CVS as appropriate, is discussed with patient. She plans to have not genetic testing this pregnancy. 11) [place] soon  Problem list reviewed and updated.  Annamarie Major, MD, [facility], [facility] 09/27/2019  3:59 PM

## 2019-09-28 LAB — RPR+RH+ABO+RUB AB+AB SCR+CB...
Antibody Screen: NEGATIVE
HIV Screen 4th Generation wRfx: NONREACTIVE
Hematocrit: 34.1 % (ref 34.0–46.6)
Hemoglobin: 11.4 g/dL (ref 11.1–15.9)
Hepatitis B Surface Ag: NEGATIVE
MCH: 28.2 pg (ref 26.6–33.0)
MCHC: 33.4 g/dL (ref 31.5–35.7)
MCV: 84 fL (ref 79–97)
Platelets: 313 10*3/uL (ref 150–450)
RBC: 4.04 x10E6/uL (ref 3.77–5.28)
RDW: 12.7 % (ref 11.7–15.4)
RPR Ser Ql: NONREACTIVE
Rh Factor: POSITIVE
Rubella Antibodies, IGG: <0.9 index — ABNORMAL LOW (ref >0.99)
Varicella zoster IgG: 1066 index (ref >165)
WBC: 14.1 10*3/uL — ABNORMAL HIGH (ref 3.4–10.8)

## 2019-10-01 LAB — GC/CHLAMYDIA PROBE AMP (~~LOC~~) NOT AT ARMC
Chlamydia: NEGATIVE
Comment: NEGATIVE
Comment: NORMAL
Neisseria Gonorrhea: NEGATIVE

## 2019-10-15 ENCOUNTER — Ambulatory Visit (INDEPENDENT_AMBULATORY_CARE_PROVIDER_SITE_OTHER): Payer: Medicaid Other | Admitting: Obstetrics and Gynecology

## 2019-10-15 ENCOUNTER — Ambulatory Visit (INDEPENDENT_AMBULATORY_CARE_PROVIDER_SITE_OTHER): Payer: Medicaid Other

## 2019-10-15 ENCOUNTER — Other Ambulatory Visit: Payer: Self-pay

## 2019-10-15 ENCOUNTER — Other Ambulatory Visit: Payer: Self-pay | Admitting: Obstetrics & Gynecology

## 2019-10-15 VITALS — BP 124/84 | Wt 185.0 lb

## 2019-10-15 DIAGNOSIS — Z3490 Encounter for supervision of normal pregnancy, unspecified, unspecified trimester: Secondary | ICD-10-CM

## 2019-10-15 DIAGNOSIS — Z3482 Encounter for supervision of other normal pregnancy, second trimester: Secondary | ICD-10-CM

## 2019-10-15 DIAGNOSIS — Z3A17 17 weeks gestation of pregnancy: Secondary | ICD-10-CM

## 2019-10-15 DIAGNOSIS — Z363 Encounter for antenatal screening for malformations: Secondary | ICD-10-CM

## 2019-10-15 DIAGNOSIS — Z3A14 14 weeks gestation of pregnancy: Secondary | ICD-10-CM

## 2019-10-15 NOTE — Progress Notes (Signed)
    Routine Prenatal Care Visit  Subjective  Sherri Brady is a 34 y.o. [phone] at [redacted]w[redacted]d being seen today for ongoing prenatal care.  She is currently monitored for the following issues for this low-risk pregnancy and has Needle phobia and Encounter for supervision of low-risk pregnancy, antepartum on their problem list.  ----------------------------------------------------------------------------------- Patient reports no complaints.   Contractions: Not present. Vag. Bleeding: None.  Movement: Absent. Denies leaking of fluid.  ----------------------------------------------------------------------------------- The following portions of the patient's history were reviewed and updated as appropriate: allergies, current medications, past family history, past medical history, past social history, past surgical history and problem list. Problem list updated.   Objective  Blood pressure 124/84, weight 185 lb (83.9 kg), last menstrual period 06/16/2019. Pregravid weight 170 lb (77.1 kg) Total Weight Gain 15 lb (6.804 kg) Urinalysis:      Fetal Status: Fetal Heart Rate (bpm): 145   Movement: Absent     General:  Alert, oriented and cooperative. Patient is in no acute distress.  Skin: Skin is warm and dry. No rash noted.   Cardiovascular: Normal heart rate noted  Respiratory: Normal respiratory effort, no problems with respiration noted  Abdomen: Soft, gravid, appropriate for gestational age. Pain/Pressure: Absent     Pelvic:  Cervical exam deferred        Extremities: Normal range of motion.     ental Status: Normal mood and affect. Normal behavior. Normal judgment and thought content.     Assessment   34 y.o. [id number] at [redacted]w[redacted]d by  03/22/2020, by Last Menstrual Period presenting for routine prenatal visit  Plan   pregnancy 8  Problems (from 06/16/19 to present)    Problem Noted Resolved   Encounter for supervision of low-risk pregnancy, antepartum 09/27/2019 by Nadara Mustard, MD No    Overview Addendum 10/15/2019  5:13 PM by Vena Austria, MD    Clinic [facility] Prenatal Labs  Dating LMP = 17 week [place] Blood type: O/Positive/-- (02/26 1605)   Genetic Screen 1 Screen:    AFP:     Quad:     NIPS: Antibody:Negative (02/26 1605)  Anatomic [place] Normal XX Rubella: <0.90 (02/26 1605) Varicella: Immune  GTT               Third trimester:  RPR: Non Reactive (02/26 1605)   Rhogam n/a HBsAg: Negative (02/26 1605)   TDaP vaccine                       Flu Shot: HIV: Non Reactive (02/26 1605)   Baby Food                                GBS:   Contraception  Pap:05/2018  CBB     CS/VBAC    Support Person               Gestational age appropriate obstetric precautions including but not limited to vaginal bleeding, contractions, leaking of fluid and fetal movement were reviewed in detail with the patient.    Return in about 4 weeks (around 11/12/2019) for ROB.  Vena Austria, MD, Evern Core [facility], [facility] 10/15/2019, 5:14 PM

## 2019-10-15 NOTE — Lactation Note (Signed)
Lactation Consultation Note  Patient Name: Sherri Brady Date: 10/15/2019   Lactation student discussed benefits of breastfeeding per the Ready[employer] Set[employer] Baby curriculum. [name] encouraged to review breastfeeding information on Ready, set, [employer] site and given information for virtual breastfeeding classes.        Willa Rough Veola Cafaro 10/15/2019, 4:44 PM

## 2019-10-15 NOTE — Progress Notes (Signed)
ROB Ultrasound 

## 2019-11-15 ENCOUNTER — Encounter: Payer: Self-pay | Admitting: Advanced Practice Midwife

## 2019-11-15 ENCOUNTER — Other Ambulatory Visit: Payer: Self-pay

## 2019-11-15 ENCOUNTER — Ambulatory Visit (INDEPENDENT_AMBULATORY_CARE_PROVIDER_SITE_OTHER): Payer: Medicaid Other | Admitting: Advanced Practice Midwife

## 2019-11-15 DIAGNOSIS — Z3A21 21 weeks gestation of pregnancy: Secondary | ICD-10-CM

## 2019-11-15 NOTE — Progress Notes (Signed)
ROB- no concerns 

## 2019-11-15 NOTE — Progress Notes (Signed)
Routine Prenatal Care Visit- Virtual Visit  Subjective   Virtual Visit via Telephone Note  I connected with Sherri Brady on 11/15/19 at  4:02 PM EDT by telephone and verified that I am speaking with the correct person using two identifiers.   I discussed the limitations, risks, security and privacy concerns of performing an evaluation and management service by telephone and the availability of in person appointments. I also discussed with the patient that there may be a patient responsible charge related to this service. The patient expressed understanding and agreed to proceed.  The patient was at the store and agrees to phone visit I spoke with the patient from my  Office phone The names of people involved in this encounter were: Sherri Brady   Sherri Brady is a 34 y.o. [phone] at [redacted]w[redacted]d being seen today for ongoing prenatal care.  She is currently monitored for the following issues for this low-risk pregnancy and has Needle phobia and Encounter for supervision of low-risk pregnancy, antepartum on their problem list.  ----------------------------------------------------------------------------------- Patient reports no complaints.   Contractions: Not present. Vag. Bleeding: None.  Movement: Present. Denies leaking of fluid.  ----------------------------------------------------------------------------------- The following portions of the patient's history were reviewed and updated as appropriate: allergies, current medications, past family history, past medical history, past social history, past surgical history and problem list. Problem list updated.   Objective  Last menstrual period 06/16/2019. Pregravid weight 170 lb (77.1 kg) Total Weight Gain 15 lb (6.804 kg) Urinalysis:      Fetal Status:     Movement: Present     Physical Exam could not be performed. Because of the COVID-19 outbreak this visit was performed over the phone and not in person.   Assessment   34  y.o. [id number] at [redacted]w[redacted]d by  03/22/2020, by Last Menstrual Period presenting for routine prenatal visit  Plan   pregnancy 8  Problems (from 06/16/19 to present)    Problem Noted Resolved   Encounter for supervision of low-risk pregnancy, antepartum 09/27/2019 by Gae Dry, MD No   Overview Addendum 10/15/2019  5:13 PM by Malachy Mood, MD    [facility]inic [facility] Prenatal Labs  Dating LMP = 17 week [place] Blood type: O/Positive/-- (02/26 1605)   Genetic Screen 1 Screen:    AFP:     Quad:     NIPS: Antibody:Negative (02/26 1605)  Anatomic [place] Normal XX Rubella: <0.90 (02/26 1605) Varicella: Immune  GTT               Third trimester:  RPR: Non Reactive (02/26 1605)   Rhogam n/a HBsAg: Negative (02/26 1605)   TDaP vaccine                       Flu Shot: HIV: Non Reactive (02/26 1605)   Baby Food                                GBS:   Contraception  Pap:05/2018  CBB     CS/VBAC    Support Person                Gestational age appropriate obstetric precautions including but not limited to vaginal bleeding, contractions, leaking of fluid and fetal movement were reviewed in detail with the patient.     Follow Up Instructions:   I discussed the assessment and treatment plan with the patient. The patient was provided  an opportunity to ask questions and all were answered. The patient agreed with the plan and demonstrated an understanding of the instructions.   The patient was advised to call back or seek an in-person evaluation if the symptoms worsen or if the condition fails to improve as anticipated.  I provided 10 minutes of non-face-to-face time during this encounter.  Return in about 4 weeks (around 12/13/2019) for rob.   Tresea Mall, CNM [facility] 11/15/2019, 4:14 PM

## 2019-12-13 ENCOUNTER — Other Ambulatory Visit: Payer: Self-pay

## 2019-12-13 ENCOUNTER — Encounter: Payer: Medicaid Other | Admitting: Advanced Practice Midwife

## 2019-12-13 ENCOUNTER — Encounter: Payer: Self-pay | Admitting: Advanced Practice Midwife

## 2019-12-13 NOTE — Progress Notes (Signed)
This encounter was created in error - please disregard.

## 2020-01-03 ENCOUNTER — Other Ambulatory Visit: Payer: Medicaid Other

## 2020-01-03 ENCOUNTER — Encounter: Payer: Medicaid Other | Admitting: Obstetrics & Gynecology

## 2020-01-10 ENCOUNTER — Other Ambulatory Visit: Payer: Self-pay | Admitting: Obstetrics & Gynecology

## 2020-01-10 ENCOUNTER — Other Ambulatory Visit: Payer: Self-pay

## 2020-01-10 ENCOUNTER — Ambulatory Visit (INDEPENDENT_AMBULATORY_CARE_PROVIDER_SITE_OTHER): Payer: Medicaid Other | Admitting: Certified Nurse Midwife

## 2020-01-10 ENCOUNTER — Other Ambulatory Visit: Payer: Medicaid Other

## 2020-01-10 VITALS — BP 102/80 | Wt 190.0 lb

## 2020-01-10 DIAGNOSIS — Z3A29 29 weeks gestation of pregnancy: Secondary | ICD-10-CM

## 2020-01-10 DIAGNOSIS — Z131 Encounter for screening for diabetes mellitus: Secondary | ICD-10-CM

## 2020-01-10 DIAGNOSIS — Z3493 Encounter for supervision of normal pregnancy, unspecified, third trimester: Secondary | ICD-10-CM

## 2020-01-10 DIAGNOSIS — Z3490 Encounter for supervision of normal pregnancy, unspecified, unspecified trimester: Secondary | ICD-10-CM

## 2020-01-10 LAB — POCT URINALYSIS DIPSTICK OB
Glucose, UA: NEGATIVE
POC,PROTEIN,UA: NEGATIVE

## 2020-01-10 NOTE — Progress Notes (Signed)
ROB/1 hr GTT- no concerns/will wait for TDAP for next visit

## 2020-01-11 LAB — 28 WEEK RH+PANEL
Basophils Absolute: 0.1 10*3/uL (ref 0.0–0.2)
Basos: 1 %
EOS (ABSOLUTE): 0.5 10*3/uL — ABNORMAL HIGH (ref 0.0–0.4)
Eos: 4 %
Gestational Diabetes Screen: 155 mg/dL — ABNORMAL HIGH (ref 65–139)
HIV Screen 4th Generation wRfx: NONREACTIVE
Hematocrit: 31.6 % — ABNORMAL LOW (ref 34.0–46.6)
Hemoglobin: 10.4 g/dL — ABNORMAL LOW (ref 11.1–15.9)
Immature Grans (Abs): 0.1 10*3/uL (ref 0.0–0.1)
Immature Granulocytes: 1 %
Lymphocytes Absolute: 1.9 10*3/uL (ref 0.7–3.1)
Lymphs: 15 %
MCH: 27.2 pg (ref 26.6–33.0)
MCHC: 32.9 g/dL (ref 31.5–35.7)
MCV: 83 fL (ref 79–97)
Monocytes Absolute: 0.8 10*3/uL (ref 0.1–0.9)
Monocytes: 7 %
Neutrophils Absolute: 9 10*3/uL — ABNORMAL HIGH (ref 1.4–7.0)
Neutrophils: 72 %
Platelets: 309 10*3/uL (ref 150–450)
RBC: 3.83 x10E6/uL (ref 3.77–5.28)
RDW: 12.3 % (ref 11.7–15.4)
RPR Ser Ql: NONREACTIVE
WBC: 12.3 10*3/uL — ABNORMAL HIGH (ref 3.4–10.8)

## 2020-01-12 NOTE — Progress Notes (Signed)
ROB at 29wk5d: Having 28 week labs today Blood type O POS. Baby active Wants to breast feed. Desires pills postpartum.  EXam: FH 32 cm. FHT 132. Negative proteinuria. BP 102/80. Weight up 5# to 190# (TWG 20#)  A: IUP at 29wk5d . Size slightly> dates   P: Breast/ pills ROB in 2 weeks TDAP at next visit  Farrel Conners, CNM

## 2020-01-13 ENCOUNTER — Telehealth: Payer: Self-pay | Admitting: Obstetrics & Gynecology

## 2020-01-13 NOTE — Telephone Encounter (Signed)
Called and left voicemail for patient to call back to be scheduled. 

## 2020-01-13 NOTE — Telephone Encounter (Signed)
-----   Message from Nadara Mustard, MD sent at 01/13/2020 10:28 AM EDT ----- Sch 3 hour GTT   I have discussed results w pt Also advised her to daily iron therapy

## 2020-01-13 NOTE — Progress Notes (Signed)
Sch 3 hour GTT   I have discussed results w pt Also advised her to daily iron therapy

## 2020-01-14 NOTE — Telephone Encounter (Signed)
Called and left voicemail for patient to call back to be scheduled. 

## 2020-01-15 NOTE — Telephone Encounter (Signed)
Patient is scheduled for 01/24/20

## 2020-01-24 ENCOUNTER — Other Ambulatory Visit: Payer: Medicaid Other

## 2020-01-24 ENCOUNTER — Encounter: Payer: Medicaid Other | Admitting: Advanced Practice Midwife

## 2020-01-31 ENCOUNTER — Other Ambulatory Visit: Payer: Medicaid Other

## 2020-01-31 ENCOUNTER — Encounter: Payer: Medicaid Other | Admitting: Obstetrics and Gynecology

## 2020-02-04 ENCOUNTER — Other Ambulatory Visit: Payer: Self-pay

## 2020-02-04 ENCOUNTER — Other Ambulatory Visit: Payer: Medicaid Other

## 2020-02-04 ENCOUNTER — Other Ambulatory Visit: Payer: Self-pay | Admitting: Certified Nurse Midwife

## 2020-02-04 ENCOUNTER — Ambulatory Visit (INDEPENDENT_AMBULATORY_CARE_PROVIDER_SITE_OTHER): Payer: Medicaid Other | Admitting: Certified Nurse Midwife

## 2020-02-04 ENCOUNTER — Encounter: Payer: Self-pay | Admitting: Certified Nurse Midwife

## 2020-02-04 VITALS — BP 110/70 | Ht 62.0 in | Wt 194.0 lb

## 2020-02-04 DIAGNOSIS — Z3493 Encounter for supervision of normal pregnancy, unspecified, third trimester: Secondary | ICD-10-CM

## 2020-02-04 DIAGNOSIS — E669 Obesity, unspecified: Secondary | ICD-10-CM

## 2020-02-04 DIAGNOSIS — O99210 Obesity complicating pregnancy, unspecified trimester: Secondary | ICD-10-CM

## 2020-02-04 DIAGNOSIS — R7309 Other abnormal glucose: Secondary | ICD-10-CM

## 2020-02-04 DIAGNOSIS — Z3A33 33 weeks gestation of pregnancy: Secondary | ICD-10-CM

## 2020-02-04 LAB — POCT URINALYSIS DIPSTICK OB
Glucose, UA: NEGATIVE
POC,PROTEIN,UA: NEGATIVE

## 2020-02-04 NOTE — Progress Notes (Signed)
ROB and 3 hr GTT today. Baby active. Having RLQ pain when moving or turning over to right side. Difficulty getting comfortable at night to sleep. Thinking about permanent contraception.    Her  1 hour GTT was 155. Weight up 4# to 194#. BMI now 35.48 kg/m2 BP 110/70 FHT 129. Negative proteinuria  A: IUP at 33wk2d  S>D BMI 35.48 kg/m2 Probable right round ligament pain  P: Discussed permanence of tubal ligation and need to sign 30 day papers if desires. Explained how procedure is done, risks of pp TL. Discussed alternatives including vasectomy Declines TDAP Discussed relief measures for round ligament pain ROB in 2 weeks 34 week instructions  Farrel Conners, CNM

## 2020-02-05 LAB — GESTATIONAL GLUCOSE TOLERANCE
Glucose, Fasting: 80 mg/dL (ref 65–94)
Glucose, GTT - 1 Hour: 186 mg/dL — ABNORMAL HIGH (ref 65–179)
Glucose, GTT - 2 Hour: 147 mg/dL (ref 65–154)
Glucose, GTT - 3 Hour: 78 mg/dL (ref 65–139)

## 2020-02-12 ENCOUNTER — Telehealth: Payer: Self-pay | Admitting: Certified Nurse Midwife

## 2020-02-12 NOTE — Telephone Encounter (Signed)
Patient is returning missed call for lab results. Please advise  °

## 2020-02-21 ENCOUNTER — Other Ambulatory Visit: Payer: Self-pay

## 2020-02-21 ENCOUNTER — Encounter: Payer: Self-pay | Admitting: Advanced Practice Midwife

## 2020-02-21 ENCOUNTER — Ambulatory Visit (INDEPENDENT_AMBULATORY_CARE_PROVIDER_SITE_OTHER): Payer: Medicaid Other

## 2020-02-21 ENCOUNTER — Ambulatory Visit (INDEPENDENT_AMBULATORY_CARE_PROVIDER_SITE_OTHER): Payer: Medicaid Other | Admitting: Advanced Practice Midwife

## 2020-02-21 ENCOUNTER — Other Ambulatory Visit: Payer: Self-pay | Admitting: Advanced Practice Midwife

## 2020-02-21 VITALS — BP 120/70 | Ht 62.0 in

## 2020-02-21 DIAGNOSIS — Z3689 Encounter for other specified antenatal screening: Secondary | ICD-10-CM | POA: Diagnosis not present

## 2020-02-21 DIAGNOSIS — Z3A35 35 weeks gestation of pregnancy: Secondary | ICD-10-CM

## 2020-02-21 DIAGNOSIS — Z3493 Encounter for supervision of normal pregnancy, unspecified, third trimester: Secondary | ICD-10-CM

## 2020-02-21 DIAGNOSIS — O99213 Obesity complicating pregnancy, third trimester: Secondary | ICD-10-CM

## 2020-02-21 LAB — POCT URINALYSIS DIPSTICK OB
Glucose, UA: NEGATIVE
POC,PROTEIN,UA: NEGATIVE

## 2020-02-21 NOTE — Progress Notes (Signed)
  Routine Prenatal Care Visit  Subjective  Sherri Brady is a 34 y.o. [phone] at [redacted]w[redacted]d being seen today for ongoing prenatal care.  She is currently monitored for the following issues for this low-risk pregnancy and has Needle phobia and Encounter for supervision of low-risk pregnancy, antepartum on their problem list.  ----------------------------------------------------------------------------------- Patient reports no complaints.  We discussed results of growth scan and potential delivery timing options. Contractions: Not present. Vag. Bleeding: None.  Movement: Present. Leaking Fluid denies.  ----------------------------------------------------------------------------------- The following portions of the patient's history were reviewed and updated as appropriate: allergies, current medications, past family history, past medical history, past social history, past surgical history and problem list. Problem list updated.  Objective  Blood pressure 120/70, height 5\' 2"  (1.575 m), last menstrual period 06/16/2019. Pregravid weight 170 lb (77.1 kg) Total Weight Gain 24 lb (10.9 kg) Urinalysis: Urine Protein Negative  Urine Glucose Negative  Fetal Status: Fetal Heart Rate (bpm): 132 Fundal Height: 37 cm Movement: Present  Presentation: Vertex   Growth: 77.4%, AC 97.7%, 7 pounds 0 ounces, AFI 7.9 cm  General:  Alert, oriented and cooperative. Patient is in no acute distress.  Skin: Skin is warm and dry. No rash noted.   Cardiovascular: Normal heart rate noted  Respiratory: Normal respiratory effort, no problems with respiration noted  Abdomen: Soft, gravid, appropriate for gestational age. Pain/Pressure: Absent     Pelvic:  Cervical exam deferred        Extremities: Normal range of motion.  Edema: Trace  Mental Status: Normal mood and affect. Normal behavior. Normal judgment and thought content.   Assessment   34 y.o. 32 at [redacted]w[redacted]d by  03/22/2020, by Last Menstrual Period presenting  for routine prenatal visit  Plan   pregnancy 8  Problems (from 06/16/19 to present)    Problem Noted Resolved   Encounter for supervision of low-risk pregnancy, antepartum 09/27/2019 by 09/29/2019, MD No   Overview Addendum 02/12/2020  5:32 PM by 02/14/2020, CNM    [facility]inic [facility] Prenatal Labs  Dating LMP = 17 week Farrel Conners Blood type: O/Positive/-- (02/26 1605)   Genetic Screen NO Antibody:Negative (02/26 1605)  Anatomic 02-18-1983 Normal XX Rubella: <0.90 (02/26 1605) Varicella: Immune  GTT               Third trimester: 155 3hr GTT: 80/186/147/78 RPR: Non Reactive (02/26 1605)   Rhogam n/a HBsAg: Negative (02/26 1605)   TDaP vaccine           declines            Flu Shot: HIV: Non Reactive (02/26 1605)   Baby Food     Breast                           GBS:   Contraception Pills Pap:05/2018  CBB     CS/VBAC    Support Person            Previous Version       Preterm labor symptoms and general obstetric precautions including but not limited to vaginal bleeding, contractions, leaking of fluid and fetal movement were reviewed in detail with the patient.   Return in about 1 week (around 02/28/2020) for nst and rob.  03/01/2020, CNM 02/21/2020 9:45 AM

## 2020-02-26 ENCOUNTER — Other Ambulatory Visit: Payer: Self-pay

## 2020-02-26 ENCOUNTER — Encounter: Payer: Self-pay | Admitting: Advanced Practice Midwife

## 2020-02-26 ENCOUNTER — Ambulatory Visit (INDEPENDENT_AMBULATORY_CARE_PROVIDER_SITE_OTHER): Payer: Medicaid Other | Admitting: Advanced Practice Midwife

## 2020-02-26 ENCOUNTER — Other Ambulatory Visit (HOSPITAL_COMMUNITY)
Admission: RE | Admit: 2020-02-26 | Discharge: 2020-02-26 | Disposition: A | Discharge status: Home / Self Care | Payer: Medicaid Other | Source: Ambulatory Visit | Attending: Advanced Practice Midwife | Admitting: Advanced Practice Midwife

## 2020-02-26 VITALS — BP 137/84 | Wt 196.0 lb

## 2020-02-26 DIAGNOSIS — Z3A36 36 weeks gestation of pregnancy: Secondary | ICD-10-CM | POA: Diagnosis present

## 2020-02-26 DIAGNOSIS — Z3493 Encounter for supervision of normal pregnancy, unspecified, third trimester: Secondary | ICD-10-CM | POA: Diagnosis not present

## 2020-02-26 DIAGNOSIS — Z3685 Encounter for antenatal screening for Streptococcus B: Secondary | ICD-10-CM

## 2020-02-26 DIAGNOSIS — Z113 Encounter for screening for infections with a predominantly sexual mode of transmission: Secondary | ICD-10-CM | POA: Diagnosis present

## 2020-02-26 LAB — POCT URINALYSIS DIPSTICK OB: Glucose, UA: NEGATIVE

## 2020-02-26 LAB — FETAL NONSTRESS TEST

## 2020-02-26 LAB — OB RESULTS CONSOLE GC/CHLAMYDIA: Gonorrhea: NEGATIVE

## 2020-02-26 NOTE — Progress Notes (Addendum)
  Routine Prenatal Care Visit  Subjective  Sherri Brady is a 34 y.o. [phone] at [redacted]w[redacted]d being seen today for ongoing prenatal care.  She is currently monitored for the following issues for this low-risk pregnancy and has Needle phobia and Encounter for supervision of low-risk pregnancy, antepartum on their problem list.  ----------------------------------------------------------------------------------- Patient reports no complaints.  She denies headache, visual changes or epigastric pain.  Contractions: Not present. Vag. Bleeding: None.  Movement: Present. Leaking Fluid denies.  ----------------------------------------------------------------------------------- The following portions of the patient's history were reviewed and updated as appropriate: allergies, current medications, past family history, past medical history, past social history, past surgical history and problem list. Problem list updated.  Objective  Blood pressure (!) 137/84, weight 196 lb (88.9 kg), last menstrual period 06/16/2019. Pregravid weight 170 lb (77.1 kg) Total Weight Gain 26 lb (11.8 kg) Urinalysis: Urine Protein Trace  Urine Glucose Negative  Fetal Status: Fetal Heart Rate (bpm): 130   Movement: Present      NST: reactive 20 minute tracing, 135 bpm baseline, moderate variability, +accelerations, -decelerations  General:  Alert, oriented and cooperative. Patient is in no acute distress.  Skin: Skin is warm and dry. No rash noted.   Cardiovascular: Normal heart rate noted  Respiratory: Normal respiratory effort, no problems with respiration noted  Abdomen: Soft, gravid, appropriate for gestational age. Pain/Pressure: Absent     Pelvic:  GBS/aptima collected        Extremities: Normal range of motion.  Edema: None  Mental Status: Normal mood and affect. Normal behavior. Normal judgment and thought content.   Assessment   34 y.o. [id number] at [redacted]w[redacted]d by  03/22/2020, by Last Menstrual Period presenting for  routine prenatal visit  Plan   pregnancy 8  Problems (from 06/16/19 to present)    Problem Noted Resolved   Encounter for supervision of low-risk pregnancy, antepartum 09/27/2019 by Nadara Mustard, MD No   Overview Addendum 02/12/2020  5:32 PM by Farrel Conners, CNM    [facility]inic [facility] Prenatal Labs  Dating LMP = 17 week [place] Blood type: O/Positive/-- (02/26 1605)   Genetic Screen NO Antibody:Negative (02/26 1605)  Anatomic [place] Normal XX Rubella: <0.90 (02/26 1605) Varicella: Immune  GTT               Third trimester: 155 3hr GTT: 80/186/147/78 RPR: Non Reactive (02/26 1605)   Rhogam n/a HBsAg: Negative (02/26 1605)   TDaP vaccine           declines            Flu Shot: HIV: Non Reactive (02/26 1605)   Baby Food     Breast                           GBS:   Contraception Pills Pap:05/2018  CBB     CS/VBAC    Support Person            Previous Version    P/C ratio sent   Preterm labor symptoms and general obstetric precautions including but not limited to vaginal bleeding, contractions, leaking of fluid and fetal movement were reviewed in detail with the patient. Please refer to After Visit Summary for other counseling recommendations.   Return in about 1 week (around 03/04/2020) for nst and rob.  Tresea Mall, CNM 02/26/2020 10:42 AM

## 2020-02-26 NOTE — Addendum Note (Signed)
Addended by: Tresea Mall on: 02/26/2020 11:59 AM   Modules accepted: Orders

## 2020-02-26 NOTE — Progress Notes (Signed)
ROB NST GBS/Aptima

## 2020-02-27 LAB — PROTEIN / CREATININE RATIO, URINE
Creatinine, Urine: 127.8 mg/dL
Protein, Ur: 38.3 mg/dL
Protein/Creat Ratio: 300 mg/g creat — ABNORMAL HIGH (ref 0–200)

## 2020-02-28 LAB — CERVICOVAGINAL ANCILLARY ONLY
Chlamydia: NEGATIVE
Comment: NEGATIVE
Comment: NEGATIVE
Comment: NORMAL
Neisseria Gonorrhea: NEGATIVE
Trichomonas: NEGATIVE

## 2020-02-28 LAB — STREP GP B NAA: Strep Gp B NAA: NEGATIVE

## 2020-03-04 ENCOUNTER — Ambulatory Visit (INDEPENDENT_AMBULATORY_CARE_PROVIDER_SITE_OTHER): Payer: Medicaid Other | Admitting: Advanced Practice Midwife

## 2020-03-04 ENCOUNTER — Other Ambulatory Visit: Payer: Self-pay

## 2020-03-04 ENCOUNTER — Encounter: Payer: Self-pay | Admitting: Advanced Practice Midwife

## 2020-03-04 VITALS — BP 125/84 | Wt 193.0 lb

## 2020-03-04 DIAGNOSIS — Z3A37 37 weeks gestation of pregnancy: Secondary | ICD-10-CM | POA: Diagnosis not present

## 2020-03-04 DIAGNOSIS — O99213 Obesity complicating pregnancy, third trimester: Secondary | ICD-10-CM | POA: Insufficient documentation

## 2020-03-04 DIAGNOSIS — Z3490 Encounter for supervision of normal pregnancy, unspecified, unspecified trimester: Secondary | ICD-10-CM

## 2020-03-04 LAB — FETAL NONSTRESS TEST

## 2020-03-04 NOTE — Progress Notes (Signed)
ROB NST 

## 2020-03-04 NOTE — Progress Notes (Signed)
  Routine Prenatal Care Visit  Subjective  Sherri Brady is a 34 y.o. [phone] at [redacted]w[redacted]d being seen today for ongoing prenatal care.  She is currently monitored for the following issues for this low-risk pregnancy and has Needle phobia; Encounter for supervision of low-risk pregnancy, antepartum; and Obesity affecting pregnancy in third trimester on their problem list.  ----------------------------------------------------------------------------------- Patient reports no complaints.  She would like to have a 39 week induction. Contractions: Not present. Vag. Bleeding: None.  Movement: Present. Leaking Fluid denies.  ----------------------------------------------------------------------------------- The following portions of the patient's history were reviewed and updated as appropriate: allergies, current medications, past family history, past medical history, past social history, past surgical history and problem list. Problem list updated.  Objective  Blood pressure 125/84, weight 193 lb (87.5 kg), last menstrual period 06/16/2019. Pregravid weight 170 lb (77.1 kg) Total Weight Gain 23 lb (10.4 kg) Urinalysis: Urine Protein    Urine Glucose    Fetal Status: Fetal Heart Rate (bpm): 130   Movement: Present      NST: reactive 30 minute tracing, 130 bpm, moderate variability, +accelerations meeting minimum criteria, -decelerations  General:  Alert, oriented and cooperative. Patient is in no acute distress.  Skin: Skin is warm and dry. No rash noted.   Cardiovascular: Normal heart rate noted  Respiratory: Normal respiratory effort, no problems with respiration noted  Abdomen: Soft, gravid, appropriate for gestational age. Pain/Pressure: Absent     Pelvic:  Cervical exam deferred        Extremities: Normal range of motion.  Edema: None  Mental Status: Normal mood and affect. Normal behavior. Normal judgment and thought content.   Assessment   34 y.o. [id number] at [redacted]w[redacted]d by  03/22/2020, by  Last Menstrual Period presenting for routine prenatal visit  Plan   pregnancy 8  Problems (from 06/16/19 to present)    Problem Noted Resolved   Encounter for supervision of low-risk pregnancy, antepartum 09/27/2019 by Nadara Mustard, MD No   Overview Addendum 02/12/2020  5:32 PM by Farrel Conners, CNM    Clinic [facility] Prenatal Labs  Dating LMP = 17 week [place] Blood type: O/Positive/-- (02/26 1605)   Genetic Screen NO Antibody:Negative (02/26 1605)  Anatomic [place] Normal XX Rubella: <0.90 (02/26 1605) Varicella: Immune  GTT               Third trimester: 155 3hr GTT: 80/186/147/78 RPR: Non Reactive (02/26 1605)   Rhogam n/a HBsAg: Negative (02/26 1605)   TDaP vaccine           declines            Flu Shot: HIV: Non Reactive (02/26 1605)   Baby Food     Breast                           GBS:   Contraception Pills Pap:05/2018  CBB     CS/VBAC    Support Person            Previous Version       Term labor symptoms and general obstetric precautions including but not limited to vaginal bleeding, contractions, leaking of fluid and fetal movement were reviewed in detail with the patient. Please refer to After Visit Summary for other counseling recommendations.   Return in about 1 week (around 03/11/2020) for nst and rob.  Tresea Mall, CNM 03/04/2020 11:02 AM

## 2020-03-11 ENCOUNTER — Other Ambulatory Visit: Payer: Self-pay

## 2020-03-11 ENCOUNTER — Ambulatory Visit (INDEPENDENT_AMBULATORY_CARE_PROVIDER_SITE_OTHER): Payer: Medicaid Other | Admitting: Obstetrics & Gynecology

## 2020-03-11 ENCOUNTER — Encounter: Payer: Self-pay | Admitting: Obstetrics & Gynecology

## 2020-03-11 VITALS — BP 130/80 | Wt 198.0 lb

## 2020-03-11 DIAGNOSIS — Z3493 Encounter for supervision of normal pregnancy, unspecified, third trimester: Secondary | ICD-10-CM

## 2020-03-11 DIAGNOSIS — O99213 Obesity complicating pregnancy, third trimester: Secondary | ICD-10-CM | POA: Diagnosis not present

## 2020-03-11 DIAGNOSIS — Z3A38 38 weeks gestation of pregnancy: Secondary | ICD-10-CM

## 2020-03-11 LAB — POCT URINALYSIS DIPSTICK OB
Glucose, UA: NEGATIVE
POC,PROTEIN,UA: NEGATIVE

## 2020-03-11 NOTE — Progress Notes (Signed)
  Subjective  Fetal Movement? yes Contractions? No - only one or two at night Leaking Fluid? no Vaginal Bleeding? no  Objective  BP 130/80   Wt 198 lb (89.8 kg)   LMP 06/16/2019 Comment: normal period  BMI 36.21 kg/m  General: NAD Pumonary: no increased work of breathing Abdomen: gravid, non-tender Extremities: no edema Psychiatric: mood appropriate, affect full  A NST procedure was performed with FHR monitoring and a normal baseline established, appropriate time of 20-40 minutes of evaluation, and accels >2 seen w 15x15 characteristics.  Results show a REACTIVE NST.    Assessment  34 y.o. [id number] at [redacted]w[redacted]d by  03/22/2020, by Last Menstrual Period presenting for routine prenatal visit  Plan   Problem List Items Addressed This Visit      Other   Encounter for supervision of low-risk pregnancy, antepartum   Obesity affecting pregnancy in third trimester    Other Visit Diagnoses    [redacted] weeks gestation of pregnancy    -  Primary   Relevant Orders   POC Urinalysis Dipstick OB (Completed)      pregnancy 8  Problems (from 06/16/19 to present)    Problem Noted Resolved   Encounter for supervision of low-risk pregnancy, antepartum 09/27/2019 by Nadara Mustard, MD No   Overview Addendum 02/12/2020  5:32 PM by Farrel Conners, CNM    [facility]inic [facility] Prenatal Labs  Dating LMP = 17 week [place] Blood type: O/Positive/-- (02/26 1605)   Genetic Screen NO Antibody:Negative (02/26 1605)  Anatomic [place] Normal XX Rubella: <0.90 (02/26 1605) Varicella: Immune  GTT               Third trimester: 155 3hr GTT: 80/186/147/78 RPR: Non Reactive (02/26 1605)   Rhogam n/a HBsAg: Negative (02/26 1605)   TDaP vaccine           declines            Flu Shot: HIV: Non Reactive (02/26 1605)   Baby Food     Breast                           GBS:   Contraception Pills Pap:05/2018  CBB     CS/VBAC    Support Person         PNV [facility] Labor precautions' No IOL until 41 weeks per pt preference Check  cervix nv NSt Weekly     Annamarie Major, MD, FACOG [facility], [facility] 03/11/2020  2:21 PM

## 2020-03-11 NOTE — Patient Instructions (Signed)
Nonstress Test A nonstress test is a procedure that is done during pregnancy in order to check the baby's heartbeat. The procedure can help show if the baby (fetus) is healthy. It is commonly done when:  The baby is past his or her due date.  The pregnancy is high risk.  The baby is moving less than normal.  The mother has lost a pregnancy in the past.  The health care provider suspects a problem with the baby's growth.  There is too much or too little amniotic fluid. The procedure is often done in the third trimester of pregnancy to find out if an early delivery is needed and whether such a delivery is safe. During a nonstress test, the baby's heartbeat is monitored when the baby is resting and when the baby is moving. If the baby is healthy, the heart rate will increase when he or she moves or kicks and will return to normal when he or she rests. Tell a health care provider about:  Any allergies you have.  Any medical conditions you have.  All medicines you are taking, including vitamins, herbs, eye drops, creams, and over-the-counter medicines. What are the risks? There are no risks to you or your baby from a nonstress test. This procedure should not be painful or uncomfortable. What happens before the procedure?  Eat a meal right before the test or as directed by your health care provider. Food may help encourage the baby to move.  Use the restroom right before the test. What happens during the procedure?  Two monitors will be placed on your abdomen. One will record the baby's heart rate and the other will record the contractions of your uterus.  You may be asked to lie down on your side or to sit upright.  You may be given a button to press when you feel your baby move.  Your health care provider will listen to your baby's heartbeat and recorded it. He or she may also watch your baby's heartbeat on a screen.  If the baby seems to be sleeping, you may be asked to drink  some juice or soda, eat a snack, or change positions. The procedure may vary among health care providers and hospitals. What happens after the procedure?  Your health care provider will discuss the test results with you and make recommendations for the future. Depending on the results, your health care provider may order additional tests or another course of action.  If your health care provider gave you any diet or activity instructions, make sure to follow them.  Keep all follow-up visits as told by your health care provider. This is important. Summary  A nonstress test is a procedure that is done during pregnancy in order to check the baby's heartbeat. The procedure can help show if the baby is healthy.  The procedure is often done in the third trimester of pregnancy to find out if an early delivery is needed and whether such a delivery is safe.  During a nonstress test, the baby's heartbeat is monitored when the baby is resting and when the baby is moving. If the baby is healthy, the heart rate will increase when he or she moves or kicks and will return to normal when he or she rests.  Your health care provider will discuss the test results with you and make recommendations for the future. This information is not intended to replace advice given to you by your health care provider. Make sure you discuss any   questions you have with your health care provider. Document Revised: 10/27/2016 Document Reviewed: 10/27/2016 Elsevier Patient Education  2020 Elsevier Inc[employer]  

## 2020-03-19 ENCOUNTER — Encounter: Payer: Medicaid Other | Admitting: Obstetrics and Gynecology

## 2020-03-24 ENCOUNTER — Encounter: Payer: Self-pay | Admitting: Obstetrics and Gynecology

## 2020-03-24 ENCOUNTER — Ambulatory Visit (INDEPENDENT_AMBULATORY_CARE_PROVIDER_SITE_OTHER): Payer: Medicaid Other | Admitting: Obstetrics and Gynecology

## 2020-03-24 ENCOUNTER — Other Ambulatory Visit
Admission: RE | Admit: 2020-03-24 | Discharge: 2020-03-24 | Disposition: A | Discharge status: Home / Self Care | Payer: Medicaid Other | Source: Ambulatory Visit | Attending: Obstetrics and Gynecology | Admitting: Obstetrics and Gynecology

## 2020-03-24 ENCOUNTER — Other Ambulatory Visit: Payer: Self-pay

## 2020-03-24 VITALS — BP 130/76 | Ht 62.0 in | Wt 200.4 lb

## 2020-03-24 DIAGNOSIS — Z01812 Encounter for preprocedural laboratory examination: Secondary | ICD-10-CM | POA: Insufficient documentation

## 2020-03-24 DIAGNOSIS — Z3493 Encounter for supervision of normal pregnancy, unspecified, third trimester: Secondary | ICD-10-CM

## 2020-03-24 DIAGNOSIS — Z3A40 40 weeks gestation of pregnancy: Secondary | ICD-10-CM

## 2020-03-24 DIAGNOSIS — Z20822 Contact with and (suspected) exposure to covid-19: Secondary | ICD-10-CM | POA: Diagnosis not present

## 2020-03-24 DIAGNOSIS — Z3490 Encounter for supervision of normal pregnancy, unspecified, unspecified trimester: Secondary | ICD-10-CM

## 2020-03-24 LAB — POCT URINALYSIS DIPSTICK OB
Glucose, UA: NEGATIVE
POC,PROTEIN,UA: NEGATIVE

## 2020-03-24 NOTE — Patient Instructions (Addendum)
Induction 03/26/2020 at 5 am- [facility] through [facility] (main [facility] entrance)  Covid testing 03/24/2020   Labor Induction  Labor induction is when steps are taken to cause a pregnant woman to begin the labor process. Most women go into labor on their own between 37 weeks and 42 weeks of pregnancy. When this does not happen or when there is a medical need for labor to begin, steps may be taken to induce labor. Labor induction causes a pregnant woman's uterus to contract. It also causes the cervix to soften (ripen), open (dilate), and thin out (efface). Usually, labor is not induced before 39 weeks of pregnancy unless there is a medical reason to do so. Your health care provider will determine if labor induction is needed. Before inducing labor, your health care provider will consider a number of factors, including:  Your medical condition and your baby's.  How many weeks along you are in your pregnancy.  How mature your baby's lungs are.  The condition of your cervix.  The position of your baby.  The size of your birth canal. What are some reasons for labor induction? Labor may be induced if:  Your health or your baby's health is at risk.  Your pregnancy is overdue by 1 week or more.  Your water breaks but labor does not start on its own.  There is a low amount of amniotic fluid around your baby. You may also choose (elect) to have labor induced at a certain time. Generally, elective labor induction is done no earlier than 39 weeks of pregnancy. What methods are used for labor induction? Methods used for labor induction include:  Prostaglandin medicine. This medicine starts contractions and causes the cervix to dilate and ripen. It can be taken by mouth (orally) or by being inserted into the vagina (suppository).  Inserting a small, thin tube (catheter) with a balloon into the vagina and then expanding the balloon with water to dilate the cervix.  Stripping the membranes.  In this method, your health care provider gently separates amniotic sac tissue from the cervix. This causes the cervix to stretch, which in turn causes the release of a hormone called progesterone. The hormone causes the uterus to contract. This procedure is often done during an office visit, after which you will be sent home to wait for contractions to begin.  Breaking the water. In this method, your health care provider uses a small instrument to make a small hole in the amniotic sac. This eventually causes the amniotic sac to break. Contractions should begin after a few hours.  Medicine to trigger or strengthen contractions. This medicine is given through an IV that is inserted into a vein in your arm. Except for membrane stripping, which can be done in a clinic, labor induction is done in the hospital so that you and your baby can be carefully monitored. How long does it take for labor to be induced? The length of time it takes to induce labor depends on how ready your body is for labor. Some inductions can take up to 2-3 days, while others may take less than a day. Induction may take longer if:  You are induced early in your pregnancy.  It is your first pregnancy.  Your cervix is not ready. What are some risks associated with labor induction? Some risks associated with labor induction include:  Changes in fetal heart rate, such as being too high, too low, or irregular (erratic).  Failed induction.  Infection in the  mother or the baby.  Increased risk of having a cesarean delivery.  Fetal death.  Breaking off (abruption) of the placenta from the uterus (rare).  Rupture of the uterus (very rare). When induction is needed for medical reasons, the benefits of induction generally outweigh the risks. What are some reasons for not inducing labor? Labor induction should not be done if:  Your baby does not tolerate contractions.  You have had previous surgeries on your uterus, such as  a myomectomy, removal of fibroids, or a vertical scar from a previous cesarean delivery.  Your placenta lies very low in your uterus and blocks the opening of the cervix (placenta previa).  Your baby is not in a head-down position.  The umbilical cord drops down into the birth canal in front of the baby.  There are unusual circumstances, such as the baby being very early (premature).  You have had more than 2 previous cesarean deliveries. Summary  Labor induction is when steps are taken to cause a pregnant woman to begin the labor process.  Labor induction causes a pregnant woman's uterus to contract. It also causes the cervix to ripen, dilate, and efface.  Labor is not induced before 39 weeks of pregnancy unless there is a medical reason to do so.  When induction is needed for medical reasons, the benefits of induction generally outweigh the risks. This information is not intended to replace advice given to you by your health care provider. Make sure you discuss any questions you have with your health care provider. Document Revised: 07/21/2017 Document Reviewed: 08/31/2016 Elsevier Patient Education  2020 [employer].

## 2020-03-24 NOTE — Progress Notes (Signed)
    Routine Prenatal Care Visit  Subjective  Sherri Brady is a 34 y.o. [phone] at [redacted]w[redacted]d being seen today for ongoing prenatal care.  She is currently monitored for the following issues for this low-risk pregnancy and has Needle phobia; Encounter for supervision of low-risk pregnancy, antepartum; and Obesity affecting pregnancy in third trimester on their problem list.  ----------------------------------------------------------------------------------- Patient reports no complaints.   Contractions: Irregular. Vag. Bleeding: None.  Movement: Present. Denies leaking of fluid.  ----------------------------------------------------------------------------------- The following portions of the patient's history were reviewed and updated as appropriate: allergies, current medications, past family history, past medical history, past social history, past surgical history and problem list. Problem list updated.   Objective  Blood pressure 130/76, height 5\' 2"  (1.575 m), weight 200 lb 6.4 oz (90.9 kg), last menstrual period 06/16/2019. Pregravid weight 170 lb (77.1 kg) Total Weight Gain 30 lb 6.4 oz (13.8 kg) Urinalysis:      Fetal Status: Fetal Heart Rate (bpm): 120   Movement: Present  Presentation: Vertex  General:  Alert, oriented and cooperative. Patient is in no acute distress.  Skin: Skin is warm and dry. No rash noted.   Cardiovascular: Normal heart rate noted  Respiratory: Normal respiratory effort, no problems with respiration noted  Abdomen: Soft, gravid, appropriate for gestational age. Pain/Pressure: Absent     Pelvic:  Cervical exam deferred        Extremities: Normal range of motion.     Mental Status: Normal mood and affect. Normal behavior. Normal judgment and thought content.     Assessment   34 y.o. 32 at [redacted]w[redacted]d by  03/22/2020, by Last Menstrual Period presenting for routine prenatal visit  Plan   pregnancy 8  Problems (from 06/16/19 to present)    Problem Noted  Resolved   Encounter for supervision of low-risk pregnancy, antepartum 09/27/2019 by 09/29/2019, MD No   Overview Addendum 02/12/2020  5:32 PM by 02/14/2020, CNM    [facility]inic [facility] Prenatal Labs  Dating LMP = 17 week Farrel Conners Blood type: O/Positive/-- (02/26 1605)   Genetic Screen NO Antibody:Negative (02/26 1605)  Anatomic 02-18-1983 Normal XX Rubella: <0.90 (02/26 1605) Varicella: Immune  GTT               Third trimester: 155 3hr GTT: 80/186/147/78 RPR: Non Reactive (02/26 1605)   Rhogam n/a HBsAg: Negative (02/26 1605)   TDaP vaccine           declines            Flu Shot: HIV: Non Reactive (02/26 1605)   Baby Food     Breast                           GBS:   Contraception Pills Pap:05/2018  CBB     CS/VBAC    Support Person            Previous Version       Induction later this week on 03/26/2020  Gestational age appropriate obstetric precautions including but not limited to vaginal bleeding, contractions, leaking of fluid and fetal movement were reviewed in detail with the patient.    Return if symptoms worsen or fail to improve.  03/28/2020 MD [facility], [facility] 03/24/2020, 9:48 AM

## 2020-03-25 LAB — SARS CORONAVIRUS 2 (TAT 6-24 HRS): SARS Coronavirus 2: NEGATIVE

## 2020-03-26 ENCOUNTER — Inpatient Hospital Stay
Admission: EM | Admit: 2020-03-26 | Discharge: 2020-03-29 | DRG: 806 | Disposition: A | Discharge status: Home / Self Care | Payer: Medicaid Other | Attending: Certified Nurse Midwife | Admitting: Certified Nurse Midwife

## 2020-03-26 ENCOUNTER — Other Ambulatory Visit: Payer: Self-pay

## 2020-03-26 ENCOUNTER — Encounter: Payer: Self-pay | Admitting: Obstetrics and Gynecology

## 2020-03-26 DIAGNOSIS — O9081 Anemia of the puerperium: Secondary | ICD-10-CM | POA: Diagnosis not present

## 2020-03-26 DIAGNOSIS — O99214 Obesity complicating childbirth: Secondary | ICD-10-CM | POA: Diagnosis present

## 2020-03-26 DIAGNOSIS — E669 Obesity, unspecified: Secondary | ICD-10-CM | POA: Diagnosis present

## 2020-03-26 DIAGNOSIS — Z3A40 40 weeks gestation of pregnancy: Secondary | ICD-10-CM | POA: Diagnosis not present

## 2020-03-26 DIAGNOSIS — O99334 Smoking (tobacco) complicating childbirth: Secondary | ICD-10-CM | POA: Diagnosis present

## 2020-03-26 DIAGNOSIS — O480 Post-term pregnancy: Principal | ICD-10-CM | POA: Diagnosis present

## 2020-03-26 DIAGNOSIS — O26893 Other specified pregnancy related conditions, third trimester: Secondary | ICD-10-CM | POA: Diagnosis present

## 2020-03-26 DIAGNOSIS — O134 Gestational [pregnancy-induced] hypertension without significant proteinuria, complicating childbirth: Secondary | ICD-10-CM | POA: Diagnosis present

## 2020-03-26 DIAGNOSIS — D62 Acute posthemorrhagic anemia: Secondary | ICD-10-CM | POA: Diagnosis not present

## 2020-03-26 DIAGNOSIS — F17210 Nicotine dependence, cigarettes, uncomplicated: Secondary | ICD-10-CM | POA: Diagnosis present

## 2020-03-26 LAB — COMPREHENSIVE METABOLIC PANEL
ALT: 7 U/L (ref 0–44)
AST: 20 U/L (ref 15–41)
Albumin: 3.2 g/dL — ABNORMAL LOW (ref 3.5–5.0)
Alkaline Phosphatase: 150 U/L — ABNORMAL HIGH (ref 38–126)
Anion gap: 10 (ref 5–15)
BUN: 8 mg/dL (ref 6–20)
CO2: 22 mmol/L (ref 22–32)
Calcium: 8.5 mg/dL — ABNORMAL LOW (ref 8.9–10.3)
Chloride: 103 mmol/L (ref 98–111)
Creatinine, Ser: 0.5 mg/dL (ref 0.44–1.00)
GFR calc Af Amer: >60 mL/min (ref >60)
GFR calc non Af Amer: >60 mL/min (ref >60)
Glucose, Bld: 84 mg/dL (ref 70–99)
Potassium: 3.8 mmol/L (ref 3.5–5.1)
Sodium: 135 mmol/L (ref 135–145)
Total Bilirubin: 0.6 mg/dL (ref 0.3–1.2)
Total Protein: 7.2 g/dL (ref 6.5–8.1)

## 2020-03-26 LAB — CBC
HCT: 36 % (ref 36.0–46.0)
Hemoglobin: 11.6 g/dL — ABNORMAL LOW (ref 12.0–15.0)
MCH: 25.8 pg — ABNORMAL LOW (ref 26.0–34.0)
MCHC: 32.2 g/dL (ref 30.0–36.0)
MCV: 80 fL (ref 80.0–100.0)
Platelets: 298 10*3/uL (ref 150–400)
RBC: 4.5 MIL/uL (ref 3.87–5.11)
RDW: 14.4 % (ref 11.5–15.5)
WBC: 12.8 10*3/uL — ABNORMAL HIGH (ref 4.0–10.5)
nRBC: 0 % (ref 0.0–0.2)

## 2020-03-26 LAB — PROTEIN / CREATININE RATIO, URINE
Creatinine, Urine: 79 mg/dL
Protein Creatinine Ratio: 0.15 mg/mg{Cre} (ref 0.00–0.15)
Total Protein, Urine: 12 mg/dL

## 2020-03-26 LAB — TYPE AND SCREEN
ABO/RH(D): O POS
Antibody Screen: NEGATIVE

## 2020-03-26 MED ORDER — MISOPROSTOL 25 MCG QUARTER TABLET
25.0000 ug | ORAL_TABLET | ORAL | Status: DC | PRN
  Administered 2020-03-26: 25 ug via VAGINAL
  Filled 2020-03-26: qty 1

## 2020-03-26 MED ORDER — ONDANSETRON HCL 4 MG PO TABS
4.0000 mg | ORAL_TABLET | ORAL | Status: DC | PRN
  Filled 2020-03-26: qty 1

## 2020-03-26 MED ORDER — PRENATAL MULTIVITAMIN CH
1.0000 | ORAL_TABLET | Freq: Every day | ORAL | Status: DC
  Administered 2020-03-27: 1 via ORAL
  Filled 2020-03-26: qty 1

## 2020-03-26 MED ORDER — NIFEDIPINE ER OSMOTIC RELEASE 30 MG PO TB24
30.0000 mg | ORAL_TABLET | Freq: Every day | ORAL | Status: DC
  Administered 2020-03-26 – 2020-03-27 (×2): 30 mg via ORAL
  Filled 2020-03-26 (×2): qty 1

## 2020-03-26 MED ORDER — ACETAMINOPHEN 500 MG PO TABS
1000.0000 mg | ORAL_TABLET | Freq: Four times a day (QID) | ORAL | Status: DC
  Administered 2020-03-27 – 2020-03-28 (×2): 1000 mg via ORAL
  Filled 2020-03-26 (×6): qty 2

## 2020-03-26 MED ORDER — SIMETHICONE 80 MG PO CHEW: 80.0000 mg | CHEWABLE_TABLET | ORAL | Status: DC | PRN

## 2020-03-26 MED ORDER — ONDANSETRON HCL 4 MG/2ML IJ SOLN: 4.0000 mg | Freq: Four times a day (QID) | INTRAMUSCULAR | Status: DC | PRN

## 2020-03-26 MED ORDER — TERBUTALINE SULFATE 1 MG/ML IJ SOLN: 0.2500 mg | Freq: Once | INTRAMUSCULAR | Status: DC | PRN

## 2020-03-26 MED ORDER — AMMONIA AROMATIC IN INHA
RESPIRATORY_TRACT | Status: AC
  Filled 2020-03-26: qty 10

## 2020-03-26 MED ORDER — OXYTOCIN-SODIUM CHLORIDE 30-0.9 UT/500ML-% IV SOLN
2.5000 [IU]/h | INTRAVENOUS | Status: DC
  Filled 2020-03-26: qty 1000

## 2020-03-26 MED ORDER — OXYTOCIN-SODIUM CHLORIDE 30-0.9 UT/500ML-% IV SOLN
1.0000 m[IU]/min | INTRAVENOUS | Status: DC
  Administered 2020-03-26: 2 m[IU]/min via INTRAVENOUS

## 2020-03-26 MED ORDER — OXYTOCIN BOLUS FROM INFUSION
333.0000 mL | Freq: Once | INTRAVENOUS | Status: AC
  Administered 2020-03-26: 333 mL via INTRAVENOUS

## 2020-03-26 MED ORDER — LIDOCAINE HCL (PF) 1 % IJ SOLN: 30.0000 mL | INTRAMUSCULAR | Status: DC | PRN

## 2020-03-26 MED ORDER — MAGNESIUM SULFATE BOLUS VIA INFUSION
4.0000 g | Freq: Once | INTRAVENOUS | Status: AC
  Administered 2020-03-26: 4 g via INTRAVENOUS
  Filled 2020-03-26: qty 1000

## 2020-03-26 MED ORDER — MISOPROSTOL 200 MCG PO TABS
ORAL_TABLET | ORAL | Status: AC
  Filled 2020-03-26: qty 4

## 2020-03-26 MED ORDER — LACTATED RINGERS IV SOLN: INTRAVENOUS | Status: DC

## 2020-03-26 MED ORDER — WITCH HAZEL-GLYCERIN EX PADS: 1.0000 "application " | MEDICATED_PAD | CUTANEOUS | Status: DC | PRN

## 2020-03-26 MED ORDER — LABETALOL HCL 5 MG/ML IV SOLN
40.0000 mg | INTRAVENOUS | Status: DC | PRN
  Filled 2020-03-26: qty 8

## 2020-03-26 MED ORDER — ACETAMINOPHEN 325 MG PO TABS: 650.0000 mg | ORAL_TABLET | ORAL | Status: DC | PRN

## 2020-03-26 MED ORDER — ZOLPIDEM TARTRATE 5 MG PO TABS: 5.0000 mg | ORAL_TABLET | Freq: Every evening | ORAL | Status: DC | PRN

## 2020-03-26 MED ORDER — MAGNESIUM SULFATE 40 GM/1000ML IV SOLN
2.0000 g/h | INTRAVENOUS | Status: DC
  Filled 2020-03-26: qty 1000

## 2020-03-26 MED ORDER — LACTATED RINGERS IV SOLN: 500.0000 mL | INTRAVENOUS | Status: DC | PRN

## 2020-03-26 MED ORDER — OXYTOCIN 10 UNIT/ML IJ SOLN
INTRAMUSCULAR | Status: AC
  Filled 2020-03-26: qty 2

## 2020-03-26 MED ORDER — BUTORPHANOL TARTRATE 1 MG/ML IJ SOLN
1.0000 mg | INTRAMUSCULAR | Status: DC | PRN
  Administered 2020-03-26: 1 mg via INTRAVENOUS
  Filled 2020-03-26: qty 1

## 2020-03-26 MED ORDER — BENZOCAINE-MENTHOL 20-0.5 % EX AERO
1.0000 "application " | INHALATION_SPRAY | CUTANEOUS | Status: DC | PRN
  Administered 2020-03-27: 1 via TOPICAL
  Filled 2020-03-26: qty 56

## 2020-03-26 MED ORDER — ONDANSETRON HCL 4 MG/2ML IJ SOLN: 4.0000 mg | INTRAMUSCULAR | Status: DC | PRN

## 2020-03-26 MED ORDER — CARBOPROST TROMETHAMINE 250 MCG/ML IM SOLN
INTRAMUSCULAR | Status: AC
  Administered 2020-03-26: 250 ug via INTRAMUSCULAR
  Filled 2020-03-26: qty 1

## 2020-03-26 MED ORDER — COCONUT OIL OIL
1.0000 "application " | TOPICAL_OIL | Status: DC | PRN
  Filled 2020-03-26: qty 120

## 2020-03-26 MED ORDER — LABETALOL HCL 5 MG/ML IV SOLN: 80.0000 mg | INTRAVENOUS | Status: DC | PRN

## 2020-03-26 MED ORDER — LIDOCAINE HCL (PF) 1 % IJ SOLN
INTRAMUSCULAR | Status: AC
  Filled 2020-03-26: qty 30

## 2020-03-26 MED ORDER — HYDRALAZINE HCL 20 MG/ML IJ SOLN: 10.0000 mg | INTRAMUSCULAR | Status: DC | PRN

## 2020-03-26 MED ORDER — DIPHENOXYLATE-ATROPINE 2.5-0.025 MG PO TABS
1.0000 | ORAL_TABLET | Freq: Once | ORAL | Status: AC
  Administered 2020-03-26: 1 via ORAL
  Filled 2020-03-26: qty 1

## 2020-03-26 MED ORDER — DIPHENHYDRAMINE HCL 25 MG PO CAPS: 25.0000 mg | ORAL_CAPSULE | Freq: Four times a day (QID) | ORAL | Status: DC | PRN

## 2020-03-26 MED ORDER — DIBUCAINE (PERIANAL) 1 % EX OINT: 1.0000 "application " | TOPICAL_OINTMENT | CUTANEOUS | Status: DC | PRN

## 2020-03-26 MED ORDER — LABETALOL HCL 5 MG/ML IV SOLN
20.0000 mg | INTRAVENOUS | Status: DC | PRN
  Administered 2020-03-26 – 2020-03-28 (×2): 20 mg via INTRAVENOUS
  Filled 2020-03-26 (×2): qty 4

## 2020-03-26 MED ORDER — IBUPROFEN 600 MG PO TABS
600.0000 mg | ORAL_TABLET | Freq: Four times a day (QID) | ORAL | Status: DC
  Administered 2020-03-26 – 2020-03-29 (×6): 600 mg via ORAL
  Filled 2020-03-26 (×9): qty 1

## 2020-03-26 MED ORDER — DOCUSATE SODIUM 100 MG PO CAPS
100.0000 mg | ORAL_CAPSULE | Freq: Two times a day (BID) | ORAL | Status: DC
  Administered 2020-03-27 – 2020-03-29 (×4): 100 mg via ORAL
  Filled 2020-03-26 (×5): qty 1

## 2020-03-26 NOTE — Progress Notes (Signed)
Sherri Brady is a 34 y.o. [id number] at [redacted]w[redacted]d by ultrasound admitted for induction of labor due to Post dates. Due date 03/22/2020.  Subjective:   Objective: BP (!) 136/96 (BP Location: Left Arm)   Pulse 77   Temp 98 F (36.7 C) (Oral)   Resp 17   Ht 5\' 2"  (1.575 m)   Wt 90.9 kg   LMP 06/16/2019 Comment: normal period  BMI 36.65 kg/m  No intake/output data recorded. No intake/output data recorded.  FHT:  FHR: 130 baseline bpm, variability: moderate,  accelerations:  Present,  decelerations:  Absent UC:   irregular, every 3-6 minutes SVE:   Dilation: 3.5 Effacement (%): 30 Station: -2 Exam by:: [email or web address], CNM  Bishop score is 4  Labs: Lab Results  Component Value Date   WBC 12.8 (H) 03/26/2020   HGB 11.6 (L) 03/26/2020   HCT 36.0 03/26/2020   MCV 80.0 03/26/2020   PLT 298 03/26/2020    Assessment / Plan: Induction of labor due to postterm and and some recent mildly elevated BPs,  progressing well on pitocin  Labor: For cervical ripening to start Preeclampsia:  no signs or symptoms of toxicity and labs stable Fetal Wellbeing:  Category I Pain Control:  IV pain meds  Planned. I/D:  n/a Anticipated MOD:  NSVD  Cytotec 03/28/2020 placed vaginally at 0835. Will re evalaute at 1230. Likely start pitocin per protocol if not laboring.  , CNM  03/26/2020 8:48 AM    03/28/2020 Sherri Brady 03/26/2020, 8:44 AM

## 2020-03-26 NOTE — Lactation Note (Signed)
This note was copied from a baby's chart. Lactation Consultation Note  Patient Name: Sherri Brady [id number] Date: 03/26/2020 Reason for consult: Initial assessment;Term  Lactation in to see mom and baby and assist with first feeding. Mom is G7P4, SVD and baby is active, alert, and interested at the breast. Mom reports breastfeeding with other children for short periods, and recalls difficult latch as main reason, desiring to pump. LC adjusted mom's position and provided pillow support, assisted with position of infant in cradle hold. Baby grasped breast easily and began strong rhythmic sucking pattern; a few audible swallows heard. Mom had no pain or discomfort.  Pumping questions addressed, encouraged to let baby feed at the breast on cue and possibility of adding in pumping a little bit later.  Reviewed early feeding cues, newborn stomach size and feeding patterns, output expectations and hand expression. Encouraged sandwiching of breast tissue and alignment of baby for deep latch for optimal transfer and no pain/discomfort.   Maternal Data Formula Feeding for Exclusion: No Has patient been taught Hand Expression?: Yes Does the patient have breastfeeding experience prior to this delivery?: Yes  Feeding Feeding Type: Breast Fed  LATCH Score Latch: Grasps breast easily, tongue down, lips flanged, rhythmical sucking.  Audible Swallowing: A few with stimulation  Type of Nipple: Everted at rest and after stimulation  Comfort (Breast/Nipple): Soft / non-tender  Hold (Positioning): Assistance needed to correctly position infant at breast and maintain latch.  LATCH Score: 8  Interventions Interventions: Breast feeding basics reviewed;Assisted with latch;Support pillows;Adjust position  Lactation Tools Discussed/Used     Consult Status Consult Status: Follow-up Follow-up type: Call as needed    Danford Bad 03/26/2020, 4:18 PM

## 2020-03-26 NOTE — Progress Notes (Signed)
Subjective:  Doing well postpartum, no headaches or vision changes.  No history of elevated BPs during pregnancy.    Objective:  Vital signs in last 24 hours: Temp:  [98 F (36.7 C)-98.3 F (36.8 C)] 98.2 F (36.8 C) (08/26 1232) Pulse Rate:  [69-85] 71 (08/26 1636) Resp:  [16-18] 18 (08/26 1232) BP: (127-172)/(96-108) 172/103 (08/26 1636) Weight:  [90.9 kg] 90.9 kg (08/26 0620)    General: NAD Pulmonary: no increased work of breathing Abdomen: non-distended, non-tender, fundus firm at level of umbilicus Extremities: no edema, no erythema, no tenderness  Results for orders placed or performed during the hospital encounter of 03/26/20 (from the past 72 hour(s))  CBC     Status: Abnormal   Collection Time: 03/26/20  7:37 AM  Result Value Ref Range   WBC 12.8 (H) 4.0 - 10.5 K/uL   RBC 4.50 3.87 - 5.11 MIL/uL   Hemoglobin 11.6 (L) 12.0 - 15.0 g/dL   HCT 40.9 36 - 46 %   MCV 80.0 80.0 - 100.0 fL   MCH 25.8 (L) 26.0 - 34.0 pg   MCHC 32.2 30.0 - 36.0 g/dL   RDW 73.5 32.9 - 92.4 %   Platelets 298 150 - 400 K/uL   nRBC 0.0 0.0 - 0.2 %    Comment: Performed at [facility], [address] [place], [place] [address]  Comprehensive metabolic panel     Status: Abnormal   Collection Time: 03/26/20  7:37 AM  Result Value Ref Range   Sodium 135 135 - 145 mmol/L   Potassium 3.8 3.5 - 5.1 mmol/L   Chloride 103 98 - 111 mmol/L   CO2 22 22 - 32 mmol/L   Glucose, Bld 84 70 - 99 mg/dL    Comment: Glucose reference range applies only to samples taken after fasting for at least 8 hours.   BUN 8 6 - 20 mg/dL   Creatinine, Ser 1.96 0.44 - 1.00 mg/dL   Calcium 8.5 (L) 8.9 - 10.3 mg/dL   Total Protein 7.2 6.5 - 8.1 g/dL   Albumin 3.2 (L) 3.5 - 5.0 g/dL   AST 20 15 - 41 U/L   ALT 7 0 - 44 U/L   Alkaline Phosphatase 150 (H) 38 - 126 U/L   Total Bilirubin 0.6 0.3 - 1.2 mg/dL   GFR calc non Af Amer >60 >60 mL/min   GFR calc Af Amer >60 >60 mL/min   Anion gap 10 5 - 15     Comment: Performed at [facility], [address] [place], [place] [address]  Protein / creatinine ratio, urine     Status: None   Collection Time: 03/26/20  7:37 AM  Result Value Ref Range   Creatinine, Urine 79 mg/dL   Total Protein, Urine 12 mg/dL    Comment: NO NORMAL RANGE ESTABLISHED FOR THIS TEST   Protein Creatinine Ratio 0.15 0.00 - 0.15 mg/mg[Cre]    Comment: Performed at [facility], [address] [place], [place] [address]  Type and screen Ordered by ORDER LAB     Status: None   Collection Time: 03/26/20  8:45 AM  Result Value Ref Range   ABO/RH(D) O POS    Antibody Screen NEG    Sample Expiration      03/29/2020,2359 Performed at [facility], [address], [place], [place] [address]    Immunization History  Administered Date(s) Administered  . Tdap 09/29/2016     Assessment:   34 y.o.  [id number] postpartum day # TSVD  Plan:    1) Acute blood loss anemia - hemodynamically stable and asymptomatic - po ferrous sulfate  2) Blood Type --/--/O POS (08/26 0845) / Rubella <0.90 (02/26 1605) / Varicella Immune   3) Severe range BP postpartum - had some mild range BP intrapartum.  No headaches of vision changes.  Given severe range BP and need to start IV antihypertensives will start magnesium sulfate.  Will monitor as to need to start any po antihypertensives following treatment  4) Feeding plan breast  5) Disposition - anticipate discharge PPD2  Vena Austria, MD, [facility], [facility] 03/26/2020, 5:12 PM

## 2020-03-26 NOTE — Discharge Summary (Signed)
Postpartum Discharge Summary  Date of Service updated 03/29/2020     Patient Name: Sherri Brady DOB: [birth year] MRN: [id number]  Date of admission: 03/26/2020 Delivery date:03/26/2020  Delivering provider: Adrian Prows R  Date of discharge: 03/29/2020  Admitting diagnosis: Labor and delivery, indication for care [O75.9] Intrauterine pregnancy: [redacted]w[redacted]d    Secondary diagnosis:  Active Problems:   Labor and delivery, indication for care   Single liveborn infant delivered vaginally   Postpartum care following vaginal delivery   Encounter for care or examination of lactating mother  Additional problems: Gestational Hypertension    Discharge diagnosis: Term Pregnancy Delivered                                              Post partum procedures: postpartum magnesium Augmentation: Pitocin and Cytotec Complications: preeclampsia  Hospital course: Induction of Labor With Vaginal Delivery   34y.o. yo [id number] admitted to the hospital 03/26/2020 for induction of labor.  Indication for induction: Favorable cervix at term.  Patient had an uncomplicated labor course as follows: Membrane Rupture Time/Date: 3:00 PM ,03/26/2020   Delivery Method:Vaginal, Spontaneous  Episiotomy: None  Lacerations:  None  Details of delivery can be found in separate delivery note.  Patient had 12 hours of postpartum IV magnesium sulfate and was treated postpartum for severe range pressures. She denies headaches, visual changes, or epigastric pain.  Patient is discharged home 03/29/20.  Newborn Data: Birth date:03/26/2020  Birth time:3:23 PM  Gender:Female  Living status:Living  Apgars:7 ,9  Weight:4110 g   Magnesium Sulfate received: No BMZ received: No Rhophylac:No MMR:No T-DaP: 2018 Flu: No Transfusion:No  Physical exam  Vitals:   03/28/20 1906 03/28/20 2309 03/29/20 0344 03/29/20 0801  BP: 130/85 (!) 136/99 120/88 (!) 137/91  Pulse: 70 74 70 73  Resp: '16 16 18 18  ' Temp:  97.7 F (36.5 C) 98.2 F (36.8 C) 98.1 F (36.7 C) 98 F (36.7 C)  TempSrc: Oral Oral Oral Oral  SpO2: 97% 97% 98% 98%  Weight:      Height:       General: alert, cooperative and no distress Lochia: appropriate Uterine Fundus: firm Incision: Healing well with no significant drainage DVT Evaluation: No evidence of DVT seen on physical exam. Labs: Lab Results  Component Value Date   WBC 17.7 (H) 03/27/2020   HGB 10.7 (L) 03/27/2020   HCT 31.5 (L) 03/27/2020   MCV 77.6 (L) 03/27/2020   PLT 263 03/27/2020   CMP Latest Ref Rng & Units 03/26/2020  Glucose 70 - 99 mg/dL 84  BUN 6 - 20 mg/dL 8  Creatinine 0.44 - 1.00 mg/dL 0.50  Sodium 135 - 145 mmol/L 135  Potassium 3.5 - 5.1 mmol/L 3.8  Chloride 98 - 111 mmol/L 103  CO2 22 - 32 mmol/L 22  Calcium 8.9 - 10.3 mg/dL 8.5(L)  Total Protein 6.5 - 8.1 g/dL 7.2  Total Bilirubin 0.3 - 1.2 mg/dL 0.6  Alkaline Phos 38 - 126 U/L 150(H)  AST 15 - 41 U/L 20  ALT 0 - 44 U/L 7   Edinburgh Score: Edinburgh Postnatal Depression Scale Screening Tool 03/27/2020  I have been able to laugh and see the funny side of things. 0  I have looked forward with enjoyment to things. 0  I have blamed myself unnecessarily when things went wrong. 0  I have been anxious or worried for no good reason. 1  I have felt scared or panicky for no good reason. 0  Things have been getting on top of me. 1  I have been so unhappy that I have had difficulty sleeping. 0  I have felt sad or miserable. 0  I have been so unhappy that I have been crying. 0  The thought of harming myself has occurred to me. 0  Edinburgh Postnatal Depression Scale Total 2      After visit meds:  Allergies as of 03/29/2020   No Known Allergies     Medication List    TAKE these medications   labetalol 200 MG tablet Commonly known as: NORMODYNE Take 1 tablet (200 mg total) by mouth 3 (three) times daily.   NIFEdipine 60 MG 24 hr tablet Commonly known as: ADALAT CC Take 1 tablet  (60 mg total) by mouth daily.   norethindrone 0.35 MG tablet Commonly known as: MICRONOR Take 1 tablet (0.35 mg total) by mouth daily.        Discharge home in stable condition Infant Feeding: formula and breast Infant Disposition:home with mother Discharge instruction: per After Visit Summary and Postpartum booklet. Activity: Advance as tolerated. Pelvic rest for 6 weeks.  Diet: routine diet  Anticipated Birth Control: POPs Postpartum Appointment:  3 days Additional Postpartum F/U: BP check  Future Appointments:No future appointments. Follow up Visit:  Follow-up Information    Schuman, Stefanie Libel, MD. Schedule an appointment as soon as possible for a visit in 3 day(s).   Specialty: Obstetrics and Gynecology Why: blood pressure check Contact information: [address] [place] [address] [phone]                 03/29/2020 Rod Can, CNM

## 2020-03-26 NOTE — H&P (Signed)
OB History & Physical   History of Present Illness:  Chief Complaint: scheduled induction of labor  HPI:  Sherri Brady is a 34 y.o. [id number] female at [redacted]w[redacted]d dated by LMP.  Her pregnancy has been complicated by obesity.    She reports contractions.   She denies leakage of fluid.   She denies vaginal bleeding.   She reports fetal movement.  She denies headache, visual changes or epigastric pain.  She is currently having her IV placed by IV team.  Total weight gain for pregnancy: 13.8 kg   Obstetrical Problem List: pregnancy 8  Problems (from 06/16/19 to present)    Problem Noted Resolved   Encounter for supervision of low-risk pregnancy, antepartum 09/27/2019 by Sherri Mustard, MD No   Overview Addendum 02/12/2020  5:32 PM by Sherri Brady, CNM    Clinic [facility] Prenatal Labs  Dating LMP = 17 week [place] Blood type: O/Positive/-- (02/26 1605)   Genetic Screen NO Antibody:Negative (02/26 1605)  Anatomic [place] Normal XX Rubella: <0.90 (02/26 1605) Varicella: Immune  GTT               Third trimester: 155 3hr GTT: 80/186/147/78 RPR: Non Reactive (02/26 1605)   Rhogam n/a HBsAg: Negative (02/26 1605)   TDaP vaccine           declines            Flu Shot: HIV: Non Reactive (02/26 1605)   Baby Food     Breast                           GBS: negative GC/CT: neg/neg  Contraception Pills Pap:05/2018  CBB     CS/VBAC    Support Person            Previous Version       Maternal Medical History:   Past Medical History:  Diagnosis Date  . Medical history non-contributory   . Patient denies medical problems     Past Surgical History:  Procedure Laterality Date  . DILATION AND CURETTAGE OF UTERUS      No Known Allergies  Prior to Admission medications   Not on File    OB History  Gravida Para Term Preterm AB Living  7 3 3  0 3 3  SAB TAB Ectopic Multiple Live Births  3 0 0 0 3    # Outcome Date GA Lbr Len/2nd Weight Sex Delivery Anes PTL Lv  7 Current           6  Term 10/30/16 [redacted]w[redacted]d 04:25 / 00:05 3634 g F Vag-Spont None  LIV  5 Term 2006     Vag-Spont   LIV  4 Term 2005     Vag-Spont   LIV  3 SAB           2 SAB           1 SAB             Obstetric Comments  G1: 03/2004, TSVD 8lbs, no issues; G2: 03/2005 TSVD, 8lbs; G3 07/2013 SAB D&C; G4 2015 SAB; G5 2017 SAB.  All SABs were early.     Prenatal care site: [facility]  Social History: She  reports that she has been smoking. She has been smoking about 0.50 packs per day. She has never used smokeless tobacco. She reports previous alcohol use. She reports previous drug use. Drug: Cocaine.  Family History: family history includes Arthritis  in her mother; Cataracts in her mother. No family history of breast or gyn cancers.   Review of Systems:  Review of Systems  Constitutional: Negative for chills and fever.  HENT: Negative for congestion, ear discharge, ear pain, hearing loss, sinus pain and sore throat.   Eyes: Negative for blurred vision and double vision.  Respiratory: Negative for cough, shortness of breath and wheezing.   Cardiovascular: Negative for chest pain, palpitations and leg swelling.  Gastrointestinal: Negative for abdominal pain, blood in stool, constipation, diarrhea, heartburn, melena, nausea and vomiting.  Genitourinary: Negative for dysuria, flank pain, frequency, hematuria and urgency.  Musculoskeletal: Negative for back pain, joint pain and myalgias.  Skin: Negative for itching and rash.  Neurological: Negative for dizziness, tingling, tremors, sensory change, speech change, focal weakness, seizures, loss of consciousness, weakness and headaches.  Endo/Heme/Allergies: Negative for environmental allergies. Does not bruise/bleed easily.  Psychiatric/Behavioral: Negative for depression, hallucinations, memory loss, substance abuse and suicidal ideas. The patient is not nervous/anxious and does not have insomnia.      Physical Exam:  BP (!) 136/96 (BP Location: Left Arm)    Pulse 77   Temp 98 F (36.7 C) (Oral)   Resp 17   Ht 5\' 2"  (1.575 m)   Wt 90.9 kg   LMP 06/16/2019 Comment: normal period  BMI 36.65 kg/m   Constitutional: Well nourished, well developed female in no acute distress.  HEENT: normal Skin: Warm and dry.  Cardiovascular: Regular rate and rhythm.   Extremity: trace edema  Respiratory: Clear to auscultation bilateral. Normal respiratory effort Abdomen: FHT present Back: no CVAT Neuro: DTRs 2+, Cranial nerves grossly intact Psych: Alert and Oriented x3. No memory deficits. Normal mood and affect.  MS: normal gait, normal bilateral lower extremity ROM/strength/stability.  Pelvic exam: per patient 3 cm in office 2 days ago   Baseline FHR: 125 beats/min   Variability: moderate   Accelerations: present   Decelerations: absent Contractions: present frequency: every 4-5 minutes Overall assessment: reassuring   Lab Results  Component Value Date   SARSCOV2NAA NEGATIVE 03/24/2020  ]  Assessment:  Sherri Brady is a 34 y.o. 32 female at [redacted]w[redacted]d with planned induction of labor. Elevated BP on admission.  Plan:  1. Admit to Labor & Delivery  2. CBC, T&S, Clrs, IVF 3. GBS negative.   4. Fetal well-being: Category I 5. Begin induction pending cervical exam 6. Elevated BP: check PIH labs  [redacted]w[redacted]d, Sherri Brady 03/26/2020 7:37 AM

## 2020-03-26 NOTE — Progress Notes (Signed)
Sherri Brady is a 34 y.o. [id number] at [redacted]w[redacted]d by ultrasound admitted for induction of labor due to Post dates. Due date 03/22/2020.  Subjective:  She was able to nap this morning. Can now feel the contractions which are mildly painful.she denies any LOF or vaginal bleeding. Hre baby is moving well.   Objective: BP (!) 150/100 (BP Location: Left Arm)    Pulse 69    Temp 98.2 F (36.8 C) (Oral)    Resp 18    Ht 5\' 2"  (1.575 m)    Wt 90.9 kg    LMP 06/16/2019 Comment: normal period   BMI 36.65 kg/m  No intake/output data recorded. No intake/output data recorded.  FHT:  FHR: 130 baseline bpm, variability: moderate,  accelerations:  Present,  decelerations:  Absent UC:   regular, every 3-5 minutes SVE:   Dilation: 4 Effacement (%): 50 Station: -2 Exam by:: [email or web address], CNM  Bishop score is now 7  Labs: Lab Results  Component Value Date   WBC 12.8 (H) 03/26/2020   HGB 11.6 (L) 03/26/2020   HCT 36.0 03/26/2020   MCV 80.0 03/26/2020   PLT 298 03/26/2020    Assessment / Plan: Induction of labor due to postterm,  progressing well  With cervical ripening.  Labor: Progressing normally  Fetal Wellbeing:  Category I Pain Control:  Labor support without medications at the moment. Plans IV pain meds I/D:  n/a Anticipated MOD:  NSVD   Will start Pitocin per protocol.  Recheck in several hours or PRN. Dr. 03/28/2020 assuming care. Report given by [facility] to Dr. Liana Crocker Shirlee Limerick, CNM  03/26/2020 12:54 PM    03/28/2020 03/26/2020, 12:50 PM

## 2020-03-27 ENCOUNTER — Encounter: Payer: Self-pay | Admitting: Obstetrics and Gynecology

## 2020-03-27 LAB — CBC
HCT: 31.5 % — ABNORMAL LOW (ref 36.0–46.0)
Hemoglobin: 10.7 g/dL — ABNORMAL LOW (ref 12.0–15.0)
MCH: 26.4 pg (ref 26.0–34.0)
MCHC: 34 g/dL (ref 30.0–36.0)
MCV: 77.6 fL — ABNORMAL LOW (ref 80.0–100.0)
Platelets: 263 10*3/uL (ref 150–400)
RBC: 4.06 MIL/uL (ref 3.87–5.11)
RDW: 14.6 % (ref 11.5–15.5)
WBC: 17.7 10*3/uL — ABNORMAL HIGH (ref 4.0–10.5)
nRBC: 0 % (ref 0.0–0.2)

## 2020-03-27 LAB — RPR: RPR Ser Ql: NONREACTIVE

## 2020-03-27 MED ORDER — LACTATED RINGERS IV SOLN: INTRAVENOUS | Status: DC

## 2020-03-27 NOTE — Lactation Note (Addendum)
This note was copied from a baby's chart. Lactation Consultation Note  Patient Name: Girl Tiarra Anastacio MEQAS'T Date: 03/27/2020   Mom has been giving bottles of formula because nipples were sore.  FOB had even given 40 ml at one feeding.  Mom had requested a pump last night but reports that was starting to be uncomfortable as well.  Kandace Blitz was starting to stir and opening mouth even though mom had just given 5 ml of formula via bottle 1 1/2 hours before.  Pointed out feeding cues to mom.  She is willing to put her back to the breast.  Assisted mom in comfortable position with pillow support in cradle hold.  Demonstrated sandwich hold.  Gianna latched with minimal assistance.  At first mom voiced some pinching and tenderness.  Demonstrated how to gently touch chin to flip out lower lip for deeper latch.  Mom reports discomfort eased up some.  She nursed for 7 to 8 minutes requiring occasional breast massage to keep her actively sucking at the breast.  Demonstrated hand expression after nursing and to rub colostrum on nipples to prevent bacteria, lubricate and to help with discomfort.  Coconut oil and comfort gels given and instructed in alternating use.  Mom breast fed other babies for short time around 6 weeks.  Explained small newborn stomach size and supply and demand encouraging mom to put Gianna to the breast whenever she demonstrated feeding cues to bring in mature milk and ensure a plentiful milk supply.  Hand out given to remind mom what to expect with feedings the first 4 days of life reviewing normal course of lactation and routine newborn feeding patterns.  Demonstrated use of manual pump for home use and reviewed breast massage, hand expression, pumping, collection, storage, labeling, cleaning and handling of expressed milk.  Community Lactation Resource sheet given and reviewed.  Lactation name and number written on white board and encouraged mom to call with any questions, concerns or  assistance.    Maternal Data    Feeding Feeding Type: Breast Fed  LATCH Score Latch: Grasps breast easily, tongue down, lips flanged, rhythmical sucking.  Audible Swallowing: A few with stimulation  Type of Nipple: Everted at rest and after stimulation  Comfort (Breast/Nipple): Soft / non-tender  Hold (Positioning): No assistance needed to correctly position infant at breast.  LATCH Score: 9  Interventions    Lactation Tools Discussed/Used     Consult Status      Louis Meckel 03/27/2020, 5:42 PM

## 2020-03-27 NOTE — Progress Notes (Signed)
Post Partum Day 1. S/P SVD/ Postpartum severe range pressures. Was begun on magnesium sulfate last night after requiring labetalol for severe range pressures. Was also begun on Procardia 30 mgm XL.   Subjective: Nurses report that Dr Kenton Kingfisher discontinued magnesium this AM at 0730. Patient reports feeling a little better since magnesium discontinued. Has been eating OK. No nausea, vomiting, CP, SOB, headache. Reports that her left groin pain persists since having her baby. Pain is sharp and worsened with some movements of her left leg Bleeding has slowed. Pain medication has helped cramping. Is breast feeding and supplementing with formula  Objective: Blood pressure 122/74, pulse 68, temperature 98.2 F (36.8 C), temperature source Oral, resp. rate 16, height '5\' 2"'  (1.575 m), weight 90.9 kg, last menstrual period 06/16/2019, SpO2 96 %, unknown if currently breastfeeding. Patient Vitals for the past 24 hrs:  BP Temp Temp src Pulse Resp SpO2  03/27/20 0833 122/74 -- -- 68 16 --  03/27/20 0750 -- -- -- -- -- 96 %  03/27/20 0745 -- -- -- -- -- 96 %  03/27/20 0740 -- -- -- -- -- 97 %  03/27/20 0735 -- -- -- -- -- 95 %  03/27/20 0730 -- -- -- -- -- 95 %  03/27/20 0725 -- -- -- -- -- 95 %  03/27/20 0720 -- -- -- -- -- 95 %  03/27/20 0715 -- -- -- -- -- 95 %  03/27/20 0710 -- -- -- -- -- 95 %  03/27/20 0705 -- -- -- -- -- 97 %  03/27/20 0702 107/70 -- -- 69 15 --  03/27/20 0700 -- -- -- -- -- 94 %  03/27/20 0528 114/78 -- -- 70 -- --  03/27/20 0450 -- -- -- -- -- 95 %  03/27/20 0445 -- -- -- -- -- 95 %  03/27/20 0440 -- -- -- -- -- 96 %  03/27/20 0426 114/62 -- -- 68 -- --  03/27/20 0415 -- -- -- -- -- 97 %  03/27/20 0410 -- -- -- -- -- 97 %  03/27/20 0405 -- -- -- -- -- 97 %  03/27/20 0400 -- -- -- -- -- 98 %  03/27/20 0355 -- -- -- -- -- 97 %  03/27/20 0350 -- -- -- -- -- 97 %  03/27/20 0345 -- -- -- -- -- 98 %  03/27/20 0340 -- -- -- -- -- 98 %  03/27/20 0335 -- -- -- -- -- 97 %   03/27/20 0330 -- -- -- -- -- 97 %  03/27/20 0325 -- -- -- -- -- 97 %  03/27/20 0310 -- -- -- -- -- 97 %  03/27/20 0305 -- -- -- -- -- 95 %  03/27/20 0300 -- -- -- -- -- 95 %  03/27/20 0255 -- -- -- -- -- 95 %  03/27/20 0250 -- -- -- -- -- 95 %  03/27/20 0245 -- -- -- -- -- 95 %  03/27/20 0240 -- -- -- -- -- 95 %  03/27/20 0235 -- -- -- -- -- 95 %  03/27/20 0231 135/89 -- -- 73 -- --  03/27/20 0230 -- -- -- -- -- 95 %  03/27/20 0225 -- -- -- -- -- 96 %  03/27/20 0220 -- -- -- -- -- 96 %  03/27/20 0215 -- -- -- -- -- 95 %  03/27/20 0210 -- -- -- -- -- 95 %  03/27/20 0200 -- -- -- -- -- 96 %  03/27/20 0145 -- -- -- -- -- 96 %  03/27/20 0140 -- -- -- -- -- 96 %  03/27/20 0135 -- -- -- -- -- 97 %  03/27/20 0131 125/78 -- -- 66 -- --  03/27/20 0130 -- -- -- -- -- 97 %  03/27/20 0125 -- -- -- -- -- 97 %  03/27/20 0120 -- -- -- -- -- 96 %  03/27/20 0115 -- -- -- -- -- 97 %  03/27/20 0110 -- -- -- -- -- 97 %  03/27/20 0105 -- -- -- -- -- 96 %  03/27/20 0100 -- -- -- -- -- 95 %  03/27/20 0055 -- -- -- -- -- 96 %  03/27/20 0050 -- -- -- -- -- 97 %  03/27/20 0045 -- -- -- -- -- 97 %  03/27/20 0040 -- -- -- -- -- 97 %  03/27/20 0035 -- -- -- -- -- 97 %  03/27/20 0031 131/82 -- -- 64 -- --  03/27/20 0030 -- -- -- -- -- 97 %  03/27/20 0005 -- -- -- -- -- 98 %  03/27/20 0001 -- -- -- -- -- 99 %  03/26/20 2225 -- -- -- -- -- 98 %  03/26/20 2220 -- -- -- -- -- 98 %  03/26/20 2216 140/87 -- -- 71 -- --  03/26/20 2215 -- -- -- -- -- 97 %  03/26/20 2210 -- -- -- -- -- 97 %  03/26/20 2205 -- -- -- -- -- 97 %  03/26/20 2200 -- -- -- -- -- 97 %  03/26/20 2114 (!) 145/91 -- -- 72 -- --  03/26/20 2004 (!) 146/98 -- -- 68 -- --  03/26/20 1939 (!) 158/95 -- -- 64 -- --  03/26/20 1935 -- -- -- -- -- 99 %  03/26/20 1930 -- -- -- -- -- 99 %  03/26/20 1925 -- -- -- -- -- 98 %  03/26/20 1923 (!) 157/102 -- -- 68 -- --  03/26/20 1920 -- -- -- -- -- 97 %  03/26/20 1915 -- -- -- -- -- 97 %   03/26/20 1910 -- -- -- -- -- 98 %  03/26/20 1905 -- -- -- -- -- 98 %  03/26/20 1900 -- -- -- -- -- 99 %  03/26/20 1855 -- -- -- -- -- 98 %  03/26/20 1850 -- -- -- -- -- 100 %  03/26/20 1845 -- -- -- -- -- 98 %  03/26/20 1840 -- -- -- -- -- 98 %  03/26/20 1835 -- -- -- -- -- 98 %  03/26/20 1830 -- -- -- -- 17 98 %  03/26/20 1825 -- -- -- -- -- 97 %  03/26/20 1823 (!) 141/91 -- -- 71 -- --  03/26/20 1734 (!) 151/93 -- -- 78 -- --  03/26/20 1636 (!) 172/103 -- -- 71 -- --  03/26/20 1618 (!) 160/96 -- -- 77 -- --  03/26/20 1608 (!) 127/108 -- -- 85 -- --  03/26/20 1537 (!) 138/96 -- -- 79 -- --  03/26/20 1232 (!) 150/100 98.2 F (36.8 C) Oral 69 18 --   Urine output: 250-425 ml/hr . 3600 ml urine out  yesterday Physical Exam:  General: alert, cooperative and no distress  Heart: RRR without murmur Lungs: CTAB, normal respiratory effort Lochia: appropriate/ small Uterine Fundus: firm/ U-1/ML/mildly tender. No mass palpated in left groin area DVT Evaluation: No evidence of DVT seen on physical exam. Psyche: somewhat flat  affect Recent Labs    03/26/20 0737 03/27/20 0730  HGB 11.6* 10.7*  HCT 36.0 31.5*  WBC 12.8* 17.7*  PLT 298 263    Assessment/Plan: Stable PPD #1-continue postpartum care Severe range blood pressures yesterday  S/p magnesium x 13 hours  Continue to monitor blood pressures closely off magnesium  Currently normotensive O POS, RNI, VI-offer MMR at discharge Breast and bottle-wants to get a pump for home use-will consult lactation TDAP-last TDAP 2018 Contraception: condoms  LOS: 1 day   Dalia Heading 03/27/2020, 9:29 AM

## 2020-03-28 MED ORDER — NIFEDIPINE ER OSMOTIC RELEASE 30 MG PO TB24: 30.0000 mg | ORAL_TABLET | Freq: Every day | ORAL | Status: DC

## 2020-03-28 MED ORDER — NIFEDIPINE ER OSMOTIC RELEASE 30 MG PO TB24
60.0000 mg | ORAL_TABLET | Freq: Every day | ORAL | Status: DC
  Administered 2020-03-28 – 2020-03-29 (×2): 60 mg via ORAL
  Filled 2020-03-28 (×2): qty 2

## 2020-03-28 MED ORDER — LABETALOL HCL 100 MG PO TABS
300.0000 mg | ORAL_TABLET | Freq: Three times a day (TID) | ORAL | Status: DC
Start: 2020-03-28 — End: 2020-03-28

## 2020-03-28 MED ORDER — LABETALOL HCL 200 MG PO TABS
200.0000 mg | ORAL_TABLET | Freq: Three times a day (TID) | ORAL | Status: DC
  Administered 2020-03-28 – 2020-03-29 (×3): 200 mg via ORAL
  Filled 2020-03-28 (×2): qty 1

## 2020-03-28 NOTE — Progress Notes (Signed)
BP elevated this AM, non-severe  provider Tresea Mall, CNM notified, currently rounding on pt, orders to follow.   Pt denies symptoms, No headache or vision changes, non-pitting mild edema to hands/feet.

## 2020-03-28 NOTE — Progress Notes (Signed)
Subjective:  Patient is tolerating regular diet. Her pain is controlled with PO medications. She is ambulating and voiding without difficulty. She reports sore nipples from breastfeeding and has switched to formula while in hospital. She denies headache, visual changes, epigastric pain. We discussed plan of care- adjusting anti-hypertensive medication and dosing to work towards discharge. She verbalizes understanding. She received a dose of IV labetalol at 12:41 for severe range pressures x 2.  Objective:  Vital signs in last 24 hours: Temp:  [98.2 F (36.8 C)-98.5 F (36.9 C)] 98.4 F (36.9 C) (08/28 1225) Pulse Rate:  [62-76] 76 (08/28 1240) Resp:  [16-20] 16 (08/28 1225) BP: (119-165)/(87-108) 165/105 (08/28 1240) SpO2:  [98 %-99 %] 98 % (08/28 1225)    Most recent blood pressure: 136/80 @ 1350  Patient Vitals for the past 24 hrs:  BP Temp Temp src Pulse Resp SpO2  03/28/20 1240 (!) 165/105 -- -- 76 -- --  03/28/20 1225 (!) 164/107 98.4 F (36.9 C) Oral 73 16 98 %  03/28/20 0830 (!) 148/102 -- -- 68 -- --  03/28/20 0814 (!) 151/108 98.4 F (36.9 C) -- 65 18 99 %  03/28/20 0400 (!) 143/98 -- -- -- -- --  03/28/20 0351 (!) 128/100 98.4 F (36.9 C) Oral 67 20 98 %  03/27/20 2202 (!) 150/98 98.5 F (36.9 C) Oral 73 20 98 %  03/27/20 1944 (!) 119/98 98.4 F (36.9 C) Oral 66 20 98 %  03/27/20 1800 127/90 98.2 F (36.8 C) Oral 69 -- 99 %  03/27/20 1757 127/90 -- -- 66 -- --  03/27/20 1522 131/87 -- -- 62 -- --    General: NAD Pulmonary: no increased work of breathing Abdomen: non-distended, non-tender, fundus firm at level of umbilicus Extremities: no edema, no erythema, no tenderness  Results for orders placed or performed during the hospital encounter of 03/26/20 (from the past 72 hour(s))  CBC     Status: Abnormal   Collection Time: 03/26/20  7:37 AM  Result Value Ref Range   WBC 12.8 (H) 4.0 - 10.5 K/uL   RBC 4.50 3.87 - 5.11 MIL/uL   Hemoglobin 11.6 (L) 12.0 -  15.0 g/dL   HCT 40.9 36 - 46 %   MCV 80.0 80.0 - 100.0 fL   MCH 25.8 (L) 26.0 - 34.0 pg   MCHC 32.2 30.0 - 36.0 g/dL   RDW 73.5 32.9 - 92.4 %   Platelets 298 150 - 400 K/uL   nRBC 0.0 0.0 - 0.2 %    Comment: Performed at [facility], [address] [place], [place] [address]  RPR     Status: None   Collection Time: 03/26/20  7:37 AM  Result Value Ref Range   RPR Ser Ql NON REACTIVE NON REACTIVE    Comment: Performed at [facility], [address], [place], [place] [address]  Comprehensive metabolic panel     Status: Abnormal   Collection Time: 03/26/20  7:37 AM  Result Value Ref Range   Sodium 135 135 - 145 mmol/L   Potassium 3.8 3.5 - 5.1 mmol/L   Chloride 103 98 - 111 mmol/L   CO2 22 22 - 32 mmol/L   Glucose, Bld 84 70 - 99 mg/dL    Comment: Glucose reference range applies only to samples taken after fasting for at least 8 hours.   BUN 8 6 - 20 mg/dL   Creatinine, Ser 2.97 0.44 - 1.00 mg/dL   Calcium 8.5 (  L) 8.9 - 10.3 mg/dL   Total Protein 7.2 6.5 - 8.1 g/dL   Albumin 3.2 (L) 3.5 - 5.0 g/dL   AST 20 15 - 41 U/L   ALT 7 0 - 44 U/L   Alkaline Phosphatase 150 (H) 38 - 126 U/L   Total Bilirubin 0.6 0.3 - 1.2 mg/dL   GFR calc non Af Amer >60 >60 mL/min   GFR calc Af Amer >60 >60 mL/min   Anion gap 10 5 - 15    Comment: Performed at [facility], [address] [place], [place] [address]  Protein / creatinine ratio, urine     Status: None   Collection Time: 03/26/20  7:37 AM  Result Value Ref Range   Creatinine, Urine 79 mg/dL   Total Protein, Urine 12 mg/dL    Comment: NO NORMAL RANGE ESTABLISHED FOR THIS TEST   Protein Creatinine Ratio 0.15 0.00 - 0.15 mg/mg[Cre]    Comment: Performed at [facility], [address] [place], [place] [address]  Type and screen Ordered by ORDER LAB     Status: None   Collection Time: 03/26/20  8:45 AM  Result Value Ref Range   ABO/RH(D) O POS    Antibody Screen NEG    Sample Expiration       03/29/2020,2359 Performed at [facility], [address] [place], [place] [address]   CBC     Status: Abnormal   Collection Time: 03/27/20  7:30 AM  Result Value Ref Range   WBC 17.7 (H) 4.0 - 10.5 K/uL   RBC 4.06 3.87 - 5.11 MIL/uL   Hemoglobin 10.7 (L) 12.0 - 15.0 g/dL   HCT 54.2 (L) 36 - 46 %   MCV 77.6 (L) 80.0 - 100.0 fL   MCH 26.4 26.0 - 34.0 pg   MCHC 34.0 30.0 - 36.0 g/dL   RDW 70.6 23.7 - 62.8 %   Platelets 263 150 - 400 K/uL   nRBC 0.0 0.0 - 0.2 %    Comment: Performed at [facility], [address] [place], [place] [address]    Assessment:   34 y.o. [id number] postpartum day # 2 SVD, lactating, elevated blood pressure  Plan:    1) Acute blood loss anemia - hemodynamically stable and asymptomatic - po ferrous sulfate  2) Blood Type --/--/O POS (08/26 0845) / Rubella <0.90 (02/26 1605) / Varicella Immune  3) TDAP status last received in 2018  4) Feeding plan breast and formula  5)  Education given regarding options for contraception, as well as compatibility with breast feeding if applicable.  Patient plans on oral progesterone-only contraceptive for contraception.  6) Elevated blood pressure: Continue medication adjustment until consistently mild to normal range. Procardia 60 XL started this AM at 10:00 Labetalol 200 TID started at 1:45 PM  7) Disposition: continue current care   Tresea Mall, CNM [facility] 03/28/2020, 1:59 PM

## 2020-03-29 MED ORDER — NIFEDIPINE ER 60 MG PO TB24
60.0000 mg | ORAL_TABLET | Freq: Every day | ORAL | 1 refills | Status: DC
Start: 2020-03-29 — End: 2020-04-10

## 2020-03-29 MED ORDER — NORETHINDRONE 0.35 MG PO TABS: 1.0000 | ORAL_TABLET | Freq: Every day | ORAL | 4 refills | Status: DC

## 2020-03-29 MED ORDER — LABETALOL HCL 200 MG PO TABS
200.0000 mg | ORAL_TABLET | Freq: Three times a day (TID) | ORAL | 1 refills | Status: DC
Start: 2020-03-29 — End: 2020-04-10

## 2020-03-29 NOTE — Progress Notes (Signed)
Reviewed D/C instructions with pt and family. Pt verbalized understanding of teaching. Discharged to home via W/C. Pt to schedule f/u appt.  

## 2020-04-01 ENCOUNTER — Encounter: Payer: Self-pay | Admitting: Advanced Practice Midwife

## 2020-04-01 ENCOUNTER — Other Ambulatory Visit: Payer: Self-pay

## 2020-04-01 ENCOUNTER — Ambulatory Visit (INDEPENDENT_AMBULATORY_CARE_PROVIDER_SITE_OTHER): Payer: Medicaid Other | Admitting: Advanced Practice Midwife

## 2020-04-01 VITALS — BP 143/105 | HR 78 | Ht 62.0 in | Wt 179.0 lb

## 2020-04-01 DIAGNOSIS — Z0130 Encounter for examination of blood pressure without abnormal findings: Secondary | ICD-10-CM

## 2020-04-01 NOTE — Progress Notes (Signed)
Obstetrics & Gynecology Office Visit   Chief Complaint:  Chief Complaint  Patient presents with  . Postpartum Care    Vaginal delivery 8/26, B/P check    History of Present Illness: 34 y.o. [id number] being seen for follow up blood pressure check today.  The patient is 6 days postpartumThe established diagnosis for the patient is preeclampsia without severe features.  She is currently on labetalol 200 mg TID and Procardia 60 mg XL.  She reports no current symptoms attributable to her blood pressure.  Medication list reviewed medications which may contribute to BP elevation were not noted.  Patient reports she feels fine. She denies headache, visual changes, epigastric pain. She did not take her medication this morning because it makes her feel sleepy and she had to drive herself to this appointment. She has not checked her blood pressure at home due to not having a working cuff.   Advised she go home and take medication and take it easy for the day. Return to office in 2 days while on medication so we can determine if the dosing is correct or needs to be adjusted.   Review of Systems:  Review of Systems  Constitutional: Negative for chills and fever.  HENT: Negative for congestion, ear discharge, ear pain, hearing loss, sinus pain and sore throat.   Eyes: Negative for blurred vision and double vision.  Respiratory: Negative for cough, shortness of breath and wheezing.   Cardiovascular: Negative for chest pain, palpitations and leg swelling.  Gastrointestinal: Negative for abdominal pain, blood in stool, constipation, diarrhea, heartburn, melena, nausea and vomiting.  Genitourinary: Negative for dysuria, flank pain, frequency, hematuria and urgency.  Musculoskeletal: Negative for back pain, joint pain and myalgias.  Skin: Negative for itching and rash.  Neurological: Negative for dizziness, tingling, tremors, sensory change, speech change, focal weakness, seizures, loss of  consciousness, weakness and headaches.  Endo/Heme/Allergies: Negative for environmental allergies. Does not bruise/bleed easily.  Psychiatric/Behavioral: Negative for depression, hallucinations, memory loss, substance abuse and suicidal ideas. The patient is not nervous/anxious and does not have insomnia.      Past Medical History:  Past Medical History:  Diagnosis Date  . Medical history non-contributory   . Patient denies medical problems     Past Surgical History:  Past Surgical History:  Procedure Laterality Date  . DILATION AND CURETTAGE OF UTERUS      Gynecologic History: Patient's last menstrual period was 06/16/2019.  Obstetric History: [id number]  Family History:  Family History  Problem Relation Age of Onset  . Cataracts Mother   . Arthritis Mother   . Alcohol abuse Neg Hx   . Asthma Neg Hx   . Birth defects Neg Hx   . Cancer Neg Hx   . COPD Neg Hx   . Depression Neg Hx   . Diabetes Neg Hx   . Drug abuse Neg Hx   . Early death Neg Hx   . Hearing loss Neg Hx   . Heart disease Neg Hx   . Hyperlipidemia Neg Hx   . Hypertension Neg Hx   . Kidney disease Neg Hx   . Learning disabilities Neg Hx   . Mental illness Neg Hx   . Mental retardation Neg Hx   . Miscarriages / Stillbirths Neg Hx   . Stroke Neg Hx   . Vision loss Neg Hx   . Varicose Veins Neg Hx     Social History:  Social History   Socioeconomic History  .  Marital status: Single    Spouse name: Not on file  . Number of children: Not on file  . Years of education: Not on file  . Highest education level: Not on file  Occupational History  . Not on file  Tobacco Use  . Smoking status: Current Every Day Smoker    Packs/day: 0.50  . Smokeless tobacco: Never Used  . Tobacco comment: pt has decreased smoking since finding out she is pregnant  Substance and Sexual Activity  . Alcohol use: Not Currently  . Drug use: Not Currently    Types: Cocaine  . Sexual activity: Yes    Birth  control/protection: OCP  Other Topics Concern  . Not on file  Social History Narrative  . Not on file   Social Determinants of Health   Financial Resource Strain:   . Difficulty of Paying Living Expenses: Not on file  Food Insecurity:   . Worried About Programme researcher, broadcasting/film/video in the Last Year: Not on file  . Ran Out of Food in the Last Year: Not on file  Transportation Needs:   . Lack of Transportation (Medical): Not on file  . Lack of Transportation (Non-Medical): Not on file  Physical Activity:   . Days of Exercise per Week: Not on file  . Minutes of Exercise per Session: Not on file  Stress:   . Feeling of Stress : Not on file  Social Connections:   . Frequency of Communication with Friends and Family: Not on file  . Frequency of Social Gatherings with Friends and Family: Not on file  . Attends Religious Services: Not on file  . Active Member of Clubs or Organizations: Not on file  . Attends Banker Meetings: Not on file  . Marital Status: Not on file  Intimate Partner Violence:   . Fear of Current or Ex-Partner: Not on file  . Emotionally Abused: Not on file  . Physically Abused: Not on file  . Sexually Abused: Not on file    Allergies:  No Known Allergies  Medications: Prior to Admission medications   Medication Sig Start Date End Date Taking? Authorizing Provider  labetalol (NORMODYNE) 200 MG tablet Take 1 tablet (200 mg total) by mouth 3 (three) times daily. 03/29/20  Yes Tresea Mall, CNM  NIFEdipine (ADALAT CC) 60 MG 24 hr tablet Take 1 tablet (60 mg total) by mouth daily. 03/29/20  Yes Tresea Mall, CNM  norethindrone (MICRONOR) 0.35 MG tablet Take 1 tablet (0.35 mg total) by mouth daily. 03/29/20  Yes Tresea Mall, CNM    Physical Exam Blood pressure (!) 143/105, pulse 78, height 5\' 2"  (1.575 m), weight 179 lb (81.2 kg), last menstrual period 06/16/2019, currently breastfeeding.  Patient's last menstrual period was 06/16/2019.  General:  NAD HEENT: normocephalic, anicteric Pulmonary: No increased work of breathing Cardiovascular: RRR, distal pulses 2+ Extremities: noedema, no erythema, no tenderness Neurologic: Grossly intact Psychiatric: mood appropriate, affect full  Assessment: 34 y.o. 32 presenting for blood pressure evaluation today  Plan:  1) Blood pressure - blood pressure at today's visit is elevated in the mild range.  As a result anti-hypertensive therapy will be continued with follow up in 2 days after taking medication. - additional blood work was not obtained   [id number], CNM [facility] 04/01/2020, 12:12 PM

## 2020-04-03 ENCOUNTER — Ambulatory Visit (INDEPENDENT_AMBULATORY_CARE_PROVIDER_SITE_OTHER): Payer: Medicaid Other | Admitting: Obstetrics and Gynecology

## 2020-04-03 ENCOUNTER — Encounter: Payer: Self-pay | Admitting: Obstetrics and Gynecology

## 2020-04-03 ENCOUNTER — Other Ambulatory Visit: Payer: Self-pay

## 2020-04-03 VITALS — BP 120/74 | HR 79 | Resp 18 | Ht 62.0 in | Wt 178.8 lb

## 2020-04-03 DIAGNOSIS — O165 Unspecified maternal hypertension, complicating the puerperium: Secondary | ICD-10-CM

## 2020-04-03 DIAGNOSIS — Z0130 Encounter for examination of blood pressure without abnormal findings: Secondary | ICD-10-CM

## 2020-04-03 NOTE — Progress Notes (Signed)
  OBSTETRICS POSTPARTUM CLINIC PROGRESS NOTE  Subjective:     Sherri Brady is a 35 y.o. [phone] female who presents for a postpartum visit. She is 1 week postpartum following a Term pregnancy and delivery by Vaginal, no problems at delivery.  I have fully reviewed the prenatal and intrapartum course. Anesthesia: none.  Postpartum course has been complicated by complicated by hypertension.  Baby is feeding by Bottle and Breast.  Bleeding: patient has not  resumed menses.  Bowel function is normal. Bladder function is normal.  Patient is not sexually active. Contraception method desired is pills.  Postpartum depression screening: negative.   The following portions of the patient's history were reviewed and updated as appropriate: allergies, current medications, past family history, past medical history, past social history, past surgical history and problem list.  Review of Systems Pertinent items are noted in HPI.  Objective:    BP 120/74   Pulse 79   Resp 18   Ht 5\' 2"  (1.575 m)   Wt 178 lb 12.8 oz (81.1 kg)   LMP 06/16/2019 Comment: normal period  SpO2 98%   Breastfeeding Yes   BMI 32.70 kg/m   General:  alert and no distress   Breasts:  inspection negative, no nipple discharge or bleeding, no masses or nodularity palpable  Lungs: clear to auscultation bilaterally  Heart:  regular rate and rhythm, S1, S2 normal, no murmur, click, rub or gallop  Abdomen: soft, non-tender; bowel sounds normal; no masses,  no organomegaly.     Vulva:  normal  Vagina: normal vagina, no discharge, exudate, lesion, or erythema  Cervix:  no cervical motion tenderness and no lesions  Corpus: normal size, contour, position, consistency, mobility, non-tender  Adnexa:  normal adnexa and no mass, fullness, tenderness  Rectal Exam: Not performed.          Assessment:  Post Partum Care visit There are no diagnoses linked to this encounter.  Plan:  See orders and Patient Instructions Follow up  in: 1 week or as needed.    Continue Procardia Decrease labetalol to twice a day. Discontinue labetalol if feeling dizzy.  Follow up for BP check in 1 week.   06/18/2019 MD, Adelene Idler OB/GYN, [facility] 04/03/2020 9:26 AM

## 2020-04-10 ENCOUNTER — Encounter: Payer: Self-pay | Admitting: Obstetrics and Gynecology

## 2020-04-10 ENCOUNTER — Other Ambulatory Visit: Payer: Self-pay

## 2020-04-10 ENCOUNTER — Ambulatory Visit (INDEPENDENT_AMBULATORY_CARE_PROVIDER_SITE_OTHER): Payer: Medicaid Other | Admitting: Obstetrics and Gynecology

## 2020-04-10 VITALS — BP 114/70 | HR 63 | Resp 18 | Ht 62.0 in | Wt 178.4 lb

## 2020-04-10 DIAGNOSIS — Z0130 Encounter for examination of blood pressure without abnormal findings: Secondary | ICD-10-CM

## 2020-04-10 DIAGNOSIS — O165 Unspecified maternal hypertension, complicating the puerperium: Secondary | ICD-10-CM

## 2020-04-10 NOTE — Progress Notes (Signed)
Patient ID: Sherri Brady, female   DOB: 1985-09-04, 34 y.o.   MRN: [id number]  Reason for Consult: Postpartum Care   Referred by No ref. provider found  Subjective:     HPI:  Sherri Brady is a 34 y.o. female she is 2 weeks postpartum.   Past Medical History:  Diagnosis Date  . Medical history non-contributory   . Patient denies medical problems    Family History  Problem Relation Age of Onset  . Cataracts Mother   . Arthritis Mother   . Alcohol abuse Neg Hx   . Asthma Neg Hx   . Birth defects Neg Hx   . Cancer Neg Hx   . COPD Neg Hx   . Depression Neg Hx   . Diabetes Neg Hx   . Drug abuse Neg Hx   . Early death Neg Hx   . Hearing loss Neg Hx   . Heart disease Neg Hx   . Hyperlipidemia Neg Hx   . Hypertension Neg Hx   . Kidney disease Neg Hx   . Learning disabilities Neg Hx   . Mental illness Neg Hx   . Mental retardation Neg Hx   . Miscarriages / Stillbirths Neg Hx   . Stroke Neg Hx   . Vision loss Neg Hx   . Varicose Veins Neg Hx    Past Surgical History:  Procedure Laterality Date  . DILATION AND CURETTAGE OF UTERUS      Short Social History:  Social History   Tobacco Use  . Smoking status: Current Every Day Smoker    Packs/day: 0.50  . Smokeless tobacco: Never Used  . Tobacco comment: pt has decreased smoking since finding out she is pregnant  Substance Use Topics  . Alcohol use: Not Currently    No Known Allergies  Current Outpatient Medications  Medication Sig Dispense Refill  . labetalol (NORMODYNE) 200 MG tablet Take 1 tablet (200 mg total) by mouth 3 (three) times daily. (Patient not taking: Reported on 04/10/2020) 90 tablet 1   No current facility-administered medications for this visit.    Review of Systems  Constitutional: Negative for chills, fatigue, fever and unexpected weight change.  HENT: Negative for trouble swallowing.  Eyes: Negative for loss of vision.  Respiratory: Negative for cough, shortness of breath and  wheezing.  Cardiovascular: Negative for chest pain, leg swelling, palpitations and syncope.  GI: Negative for abdominal pain, blood in stool, diarrhea, nausea and vomiting.  GU: Negative for difficulty urinating, dysuria, frequency and hematuria.  Musculoskeletal: Negative for back pain, leg pain and joint pain.  Skin: Negative for rash.  Neurological: Negative for dizziness, headaches, light-headedness, numbness and seizures.  Psychiatric: Negative for behavioral problem, confusion, depressed mood and sleep disturbance.        Objective:  Objective   Vitals:   04/10/20 1053  BP: 114/70  Pulse: 63  Resp: 18  SpO2: 98%  Weight: 178 lb 6.4 oz (80.9 kg)  Height: 5\' 2"  (1.575 m)   Body mass index is 32.63 kg/m.  Physical Exam Vitals and nursing note reviewed.  Constitutional:      Appearance: She is well-developed.  HENT:     Head: Normocephalic and atraumatic.  Eyes:     Pupils: Pupils are equal, round, and reactive to light.  Cardiovascular:     Rate and Rhythm: Normal rate and regular rhythm.  Pulmonary:     Effort: Pulmonary effort is normal. No respiratory distress.  Skin:    General: Skin  is warm and dry.  Neurological:     Mental Status: She is alert and oriented to person, place, and time.  Psychiatric:        Behavior: Behavior normal.        Thought Content: Thought content normal.        Judgment: Judgment normal.         Assessment/Plan:     33 yo s/p vaginal delivery with gestational/postpartum hypertension BP normal today Patient stopped medication Wednesday- will continue without medication Regular postpartum follow up in 4 weeks  Adelene Idler MD, [facility], [facility] 04/10/2020 11:10 AM

## 2020-05-04 ENCOUNTER — Other Ambulatory Visit: Payer: Self-pay

## 2020-05-04 ENCOUNTER — Ambulatory Visit: Payer: Medicaid Other | Admitting: Family Medicine

## 2020-05-04 ENCOUNTER — Encounter: Payer: Self-pay | Admitting: Family Medicine

## 2020-05-04 VITALS — BP 130/85 | Ht 62.0 in | Wt 183.4 lb

## 2020-05-04 DIAGNOSIS — Z789 Other specified health status: Secondary | ICD-10-CM

## 2020-05-04 MED ORDER — ULIPRISTAL ACETATE 30 MG PO TABS: 1.0000 | ORAL_TABLET | Freq: Once | ORAL | 0 refills | Status: DC

## 2020-05-04 MED ORDER — ULIPRISTAL ACETATE 30 MG PO TABS
1.0000 | ORAL_TABLET | Freq: Once | ORAL | 0 refills | Status: AC
Start: 2020-05-04 — End: 2020-05-04

## 2020-05-04 NOTE — Progress Notes (Signed)
   [facility] problem [facility]  [facility]  Subjective:  Sherri Brady is a 34 y.o. being seen today for   Chief Complaint  Patient presents with  . Postpartum Follow-up    ECP and OC    HPI  Pt is here for ECP. She has had sex twice since giving birth, once this morning and once 2 days ago, both with no condom.   She is [redacted]w[redacted]d pp. Not breastfeeding. Has pp f/u appt with [facility] on 10/8.  Patient's last menstrual period was 06/16/2019.   Does the patient have a current or past history of drug use? No   No components found for: HCV]   Health Maintenance Due  Topic Date Due  . Hepatitis C Screening  Never done  . COVID-19 Vaccine (1) Never done  . INFLUENZA VACCINE  Never done    ROS  The following portions of the patient's history were reviewed and updated as appropriate: allergies, current medications, past family history, past medical history, past social history, past surgical history and problem list. Problem list updated.   See flowsheet for other program required questions.  Objective:   Vitals:   05/04/20 1608  BP: 130/85  Weight: 183 lb 6.4 oz (83.2 kg)  Height: 5\' 2"  (1.575 m)    Physical Exam  Gen: well appearing, NAD HEENT: no scleral icterus Lung: Normal WOB Ext: well perfused, no edema   Assessment and Plan:  Sherri Brady is a 34 y.o. female presenting to the [facility] for a Women's Health problem visit   1. Emergency contraception -ECP of [facility] prescribed to pt's pharmacy today. Advised to pick it up and take asap. -Pt is interested in IUD for birth control, prefers to have this placed by [facility]. Will use condoms for all intercourse prior to this being placed.  - ulipristal acetate (ELLA) 30 MG tablet; Take 1 tablet (30 mg total) by mouth once for 1 dose.  Dispense: 1 tablet; Refill: 0  F/u as scheduled with [facility] on Friday for IUD placement.  Future Appointments  Date Time  Provider Department Center  05/08/2020  9:30 AM Schuman, 07/08/2020, MD [facility] None    Jaquelyn Bitter, PA-C

## 2020-05-04 NOTE — Progress Notes (Signed)
Patient here for ECP and wants to start OC. SVD 03/26/20 and has PP visit 05/08/20 at [facility].Marland KitchenBurt Knack, RN

## 2020-05-05 ENCOUNTER — Other Ambulatory Visit: Payer: Self-pay | Admitting: Physician Assistant

## 2020-05-05 ENCOUNTER — Other Ambulatory Visit: Payer: Self-pay | Admitting: Family Medicine

## 2020-05-05 DIAGNOSIS — Z30012 Encounter for prescription of emergency contraception: Secondary | ICD-10-CM

## 2020-05-05 MED ORDER — LEVONORGESTREL 1.5 MG PO TABS
1.5000 mg | ORAL_TABLET | Freq: Once | ORAL | 0 refills | Status: AC
Start: 2020-05-05 — End: 2020-05-05

## 2020-05-05 NOTE — Telephone Encounter (Signed)
PT. states the rx for B/[facility] is not at any [facility]?

## 2020-05-05 NOTE — Telephone Encounter (Signed)
Phone call to patient preferred pharmacy regarding confirmed Rx. Per pharmacy Rx has been received but this medication is not in stock and will not be in until 05/07/2020. Per pharmacist this medication is not kept in stock until a prescription is received and then medication is ordered. Patient informed of above and opts for alternate Plan B medication. RN informed provider [place], [place] of above. Tawny Hopping, RN

## 2020-05-05 NOTE — Progress Notes (Signed)
See RN note due to patient phone call about [facility] Rx not at any [facility].  RN called patient and explained about the [facility]eric not being available until Thursday, 05/07/2020, and patient opted to have Plan B sent to the pharmacy to take today.

## 2020-05-08 ENCOUNTER — Other Ambulatory Visit: Payer: Self-pay

## 2020-05-08 ENCOUNTER — Ambulatory Visit (INDEPENDENT_AMBULATORY_CARE_PROVIDER_SITE_OTHER): Payer: Medicaid Other | Admitting: Obstetrics and Gynecology

## 2020-05-08 ENCOUNTER — Encounter: Payer: Self-pay | Admitting: Obstetrics and Gynecology

## 2020-05-08 VITALS — BP 140/100 | Ht 62.0 in | Wt 181.0 lb

## 2020-05-08 DIAGNOSIS — Z30011 Encounter for initial prescription of contraceptive pills: Secondary | ICD-10-CM

## 2020-05-08 DIAGNOSIS — I10 Essential (primary) hypertension: Secondary | ICD-10-CM

## 2020-05-08 MED ORDER — NORETHIN ACE-ETH ESTRAD-FE 1-20 MG-MCG PO TABS: 1.0000 | ORAL_TABLET | Freq: Every day | ORAL | 11 refills | Status: DC

## 2020-05-08 MED ORDER — LABETALOL HCL 200 MG PO TABS: 200.0000 mg | ORAL_TABLET | Freq: Two times a day (BID) | ORAL | 11 refills | Status: DC

## 2020-05-08 MED ORDER — NORETHINDRONE 0.35 MG PO TABS: 1.0000 | ORAL_TABLET | Freq: Every day | ORAL | 11 refills | Status: DC

## 2020-05-08 NOTE — Progress Notes (Signed)
  OBSTETRICS POSTPARTUM CLINIC PROGRESS NOTE  Subjective:     Sherri Brady is a 34 y.o. [phone] female who presents for a postpartum visit. She is 6 weeks postpartum following a Term pregnancy and delivery by Vaginal, no problems at delivery.  I have fully reviewed the prenatal and intrapartum course. Anesthesia: epidural.  Postpartum course has been complicated by complicated by hypertension.  Baby is feeding by Bottle.  Bleeding: patient has not  resumed menses.  Bowel function is normal. Bladder function is normal.  Patient is sexually active. Contraception method desired is OCP (estrogen/progesterone).  Postpartum depression screening: negative. [place] 0.  Patient reports that she had unprotected intercourse on Saturday x2.  She was seen by the health department and prescribed [facility].  The patient was not able to pick up this medication and instead took Plan B on Wednesday.  She had been previously prescribed a progesterone only birth control pill but reports that she believes she lost the pill pack.  She was considering an IUD but her family counseled her against this.  She desires to be on estrogen progesterone birth control pills.  The following portions of the patient's history were reviewed and updated as appropriate: allergies, current medications, past family history, past medical history, past social history, past surgical history and problem list.  Review of Systems Pertinent items are noted in HPI.  Objective:    BP (!) 144/110   Ht 5\' 2"  (1.575 m)   Wt 181 lb (82.1 kg)   LMP 06/16/2019 Comment: normal period  BMI 33.11 kg/m   General:  alert and no distress   Breasts:  inspection negative, no nipple discharge or bleeding, no masses or nodularity palpable  Lungs: clear to auscultation bilaterally  Heart:  regular rate and rhythm, S1, S2 normal, no murmur, click, rub or gallop  Abdomen: soft, non-tender; bowel sounds normal; no masses,  no organomegaly.    Patient  declined pelvic exam today.       Assessment:  Post Partum Care visit There are no diagnoses linked to this encounter.  Plan:   Discussed that given patient's recent unprotected intercourse and extended interval before emergency contraception that she is at risk for being pregnant.  Recommended that the patient repeat her home pregnancy test in 2 weeks.  Will send prescription for oral birth control pill for the patient to initiate today.  Cautioned patient to not have unprotected intercourse after until 1 week of taking the oral contraceptive birth control pill. Sent POP because of HTN present at today's visit.   Elevated BP today, suspect chronic hypertension. Will start patient on labetalol 200 mg BID. Recommended following with PCP or return in in 3 months for BP check.   See orders and Patient Instructions Follow up in: 3 months or as needed.   06/18/2019 MD, Adelene Idler OB/GYN, [facility] 05/08/2020 9:48 AM

## 2020-05-08 NOTE — Patient Instructions (Addendum)
Hypertension, Adult High blood pressure (hypertension) is when the force of blood pumping through the arteries is too strong. The arteries are the blood vessels that carry blood from the heart throughout the body. Hypertension forces the heart to work harder to pump blood and may cause arteries to become narrow or stiff. Untreated or uncontrolled hypertension can cause a heart attack, heart failure, a stroke, kidney disease, and other problems. A blood pressure reading consists of a higher number over a lower number. Ideally, your blood pressure should be below 120/80. The first ("top") number is called the systolic pressure. It is a measure of the pressure in your arteries as your heart beats. The second ("bottom") number is called the diastolic pressure. It is a measure of the pressure in your arteries as the heart relaxes. What are the causes? The exact cause of this condition is not known. There are some conditions that result in or are related to high blood pressure. What increases the risk? Some risk factors for high blood pressure are under your control. The following factors may make you more likely to develop this condition:  Smoking.  Having type 2 diabetes mellitus, high cholesterol, or both.  Not getting enough exercise or physical activity.  Being overweight.  Having too much fat, sugar, calories, or salt (sodium) in your diet.  Drinking too much alcohol. Some risk factors for high blood pressure may be difficult or impossible to change. Some of these factors include:  Having chronic kidney disease.  Having a family history of high blood pressure.  Age. Risk increases with age.  Race. You may be at higher risk if you are African American.  Gender. Men are at higher risk than women before age 45. After age 65, women are at higher risk than men.  Having obstructive sleep apnea.  Stress. What are the signs or symptoms? High blood pressure may not cause symptoms. Very high  blood pressure (hypertensive crisis) may cause:  Headache.  Anxiety.  Shortness of breath.  Nosebleed.  Nausea and vomiting.  Vision changes.  Severe chest pain.  Seizures. How is this diagnosed? This condition is diagnosed by measuring your blood pressure while you are seated, with your arm resting on a flat surface, your legs uncrossed, and your feet flat on the floor. The cuff of the blood pressure monitor will be placed directly against the skin of your upper arm at the level of your heart. It should be measured at least twice using the same arm. Certain conditions can cause a difference in blood pressure between your right and left arms. Certain factors can cause blood pressure readings to be lower or higher than normal for a short period of time:  When your blood pressure is higher when you are in a health care provider's office than when you are at home, this is called white coat hypertension. Most people with this condition do not need medicines.  When your blood pressure is higher at home than when you are in a health care provider's office, this is called masked hypertension. Most people with this condition may need medicines to control blood pressure. If you have a high blood pressure reading during one visit or you have normal blood pressure with other risk factors, you may be asked to:  Return on a different day to have your blood pressure checked again.  Monitor your blood pressure at home for 1 week or longer. If you are diagnosed with hypertension, you may have other blood or   imaging tests to help your health care provider understand your overall risk for other conditions. How is this treated? This condition is treated by making healthy lifestyle changes, such as eating healthy foods, exercising more, and reducing your alcohol intake. Your health care provider may prescribe medicine if lifestyle changes are not enough to get your blood pressure under control, and  if:  Your systolic blood pressure is above 130.  Your diastolic blood pressure is above 80. Your personal target blood pressure may vary depending on your medical conditions, your age, and other factors. Follow these instructions at home: Eating and drinking   Eat a diet that is high in fiber and potassium, and low in sodium, added sugar, and fat. An example eating plan is called the DASH (Dietary Approaches to Stop Hypertension) diet. To eat this way: ? Eat plenty of fresh fruits and vegetables. Try to fill one half of your plate at each meal with fruits and vegetables. ? Eat whole grains, such as whole-wheat pasta, brown rice, or whole-grain bread. Fill about one fourth of your plate with whole grains. ? Eat or drink low-fat dairy products, such as skim milk or low-fat yogurt. ? Avoid fatty cuts of meat, processed or cured meats, and poultry with skin. Fill about one fourth of your plate with lean proteins, such as fish, chicken without skin, beans, eggs, or tofu. ? Avoid pre-made and processed foods. These tend to be higher in sodium, added sugar, and fat.  Reduce your daily sodium intake. Most people with hypertension should eat less than 1,500 mg of sodium a day.  Do not drink alcohol if: ? Your health care provider tells you not to drink. ? You are pregnant, may be pregnant, or are planning to become pregnant.  If you drink alcohol: ? Limit how much you use to:  0-1 drink a day for women.  0-2 drinks a day for men. ? Be aware of how much alcohol is in your drink. In the [place] one drink equals one 12 oz bottle of beer (355 mL), one 5 oz glass of wine (148 mL), or one 1 oz glass of hard liquor (44 mL). Lifestyle   Work with your health care provider to maintain a healthy body weight or to lose weight. Ask what an ideal weight is for you.  Get at least 30 minutes of exercise most days of the week. Activities may include walking, swimming, or biking.  Include exercise to  strengthen your muscles (resistance exercise), such as Pilates or lifting weights, as part of your weekly exercise routine. Try to do these types of exercises for 30 minutes at least 3 days a week.  Do not use any products that contain nicotine or tobacco, such as cigarettes, e-cigarettes, and chewing tobacco. If you need help quitting, ask your health care provider.  Monitor your blood pressure at home as told by your health care provider.  Keep all follow-up visits as told by your health care provider. This is important. Medicines  Take over-the-counter and prescription medicines only as told by your health care provider. Follow directions carefully. Blood pressure medicines must be taken as prescribed.  Do not skip doses of blood pressure medicine. Doing this puts you at risk for problems and can make the medicine less effective.  Ask your health care provider about side effects or reactions to medicines that you should watch for. Contact a health care provider if you:  Think you are having a reaction to a medicine you   are taking.  Have headaches that keep coming back (recurring).  Feel dizzy.  Have swelling in your ankles.  Have trouble with your vision. Get help right away if you:  Develop a severe headache or confusion.  Have unusual weakness or numbness.  Feel faint.  Have severe pain in your chest or abdomen.  Vomit repeatedly.  Have trouble breathing. Summary  Hypertension is when the force of blood pumping through your arteries is too strong. If this condition is not controlled, it may put you at risk for serious complications.  Your personal target blood pressure may vary depending on your medical conditions, your age, and other factors. For most people, a normal blood pressure is less than 120/80.  Hypertension is treated with lifestyle changes, medicines, or a combination of both. Lifestyle changes include losing weight, eating a healthy, low-sodium diet,  exercising more, and limiting alcohol. This information is not intended to replace advice given to you by your health care provider. Make sure you discuss any questions you have with your health care provider. Document Revised: 03/28/2018 Document Reviewed: 03/28/2018 Elsevier Patient Education  2020 [employer]. Oral Contraception Use Oral contraceptive pills (OCPs) are medicines that you take to prevent pregnancy. OCPs work by:  Preventing the ovaries from releasing eggs.  Thickening mucus in the lower part of the uterus (cervix), which prevents sperm from entering the uterus.  Thinning the lining of the uterus (endometrium), which prevents a fertilized egg from attaching to the endometrium. OCPs are highly effective when taken exactly as prescribed. However, OCPs do not prevent sexually transmitted infections (STIs). Safe sex practices, such as using condoms while on an OCP, can help prevent STIs. Before taking OCPs, you may have a physical exam, blood test, and Pap test. A Pap test involves taking a sample of cells from your cervix to check for cancer. Discuss with your health care provider the possible side effects of the OCP you may be prescribed. When you start an OCP, be aware that it can take 2-3 months for your body to adjust to changes in hormone levels. How to take oral contraceptive pills Follow instructions from your health care provider about how to start taking your first cycle of OCPs. Your health care provider may recommend that you:  Start the pill on day 1 of your menstrual period. If you start at this time, you will not need any backup form of birth control (contraception), such as condoms.  Start the pill on the first Sunday after your menstrual period or on the day you get your prescription. In these cases, you will need to use backup contraception for the first week.  Start the pill at any time of your cycle. ? If you take the pill within 5 days of the start of your  period, you will not need a backup form of contraception. ? If you start at any other time of your menstrual cycle, you will need to use another form of contraception for 7 days. If your OCP is the type called a minipill, it will protect you from pregnancy after taking it for 2 days (48 hours), and you can stop using backup contraception after that time. After you have started taking OCPs:  If you forget to take 1 pill, take it as soon as you remember. Take the next pill at the regular time.  If you miss 2 or more pills, call your health care provider. Different pills have different instructions for missed doses. Use backup birth control  until your next menstrual period starts.  If you use a 28-day pack that contains inactive pills and you miss 1 of the last 7 pills (pills with no hormones), throw away the rest of the non-hormone pills and start a new pill pack. No matter which day you start the OCP, you will always start a new pack on that same day of the week. Have an extra pack of OCPs and a backup contraceptive method available in case you miss some pills or lose your OCP pack. Follow these instructions at home:  Do not use any products that contain nicotine or tobacco, such as cigarettes and e-cigarettes. If you need help quitting, ask your health care provider.  Always use a condom to protect against STIs. OCPs do not protect against STIs.  Use a calendar to mark the days of your menstrual period.  Read the information and directions that came with your OCP. Talk to your health care provider if you have questions. Contact a health care provider if:  You develop nausea and vomiting.  You have abnormal vaginal discharge or bleeding.  You develop a rash.  You miss your menstrual period. Depending on the type of OCP you are taking, this may be a sign of pregnancy. Ask your health care provider for more information.  You are losing your hair.  You need treatment for mood swings or  depression.  You get dizzy when taking the OCP.  You develop acne after taking the OCP.  You become pregnant or think you may be pregnant.  You have diarrhea, constipation, and abdominal pain or cramps.  You miss 2 or more pills. Get help right away if:  You develop chest pain.  You develop shortness of breath.  You have an uncontrolled or severe headache.  You develop numbness or slurred speech.  You develop visual or speech problems.  You develop pain, redness, and swelling in your legs.  You develop weakness or numbness in your arms or legs. Summary  Oral contraceptive pills (OCPs) are medicines that you take to prevent pregnancy.  OCPs do not prevent sexually transmitted infections (STIs). Always use a condom to protect against STIs.  When you start an OCP, be aware that it can take 2-3 months for your body to adjust to changes in hormone levels.  Read all the information and directions that come with your OCP. This information is not intended to replace advice given to you by your health care provider. Make sure you discuss any questions you have with your health care provider. Document Revised: 11/09/2018 Document Reviewed: 08/29/2016 Elsevier Patient Education  [employer]. Emergency Contraception Emergency contraception refers to birth control methods that prevent pregnancy after unprotected sex. Emergency contraception may be recommended:  When a condom breaks.  After a sexual assault.  If you forgot to take your birth control pill.  If you used inadequate contraception during sex. Emergency contraception is most effective if used as soon as possible after sex. It is important to note that it does not protect against STIs (sexually transmitted infections). Do not use emergency contraception as your only form of birth control. Types of emergency contraceptives Emergency contraception must be used within 5 days after having unprotected sex. The  following types of emergency contraception are available:  Hormonal pills that work by preventing the ovaries from releasing an egg (ovulation) and preventing fertilization of an egg. There are two types of hormonal pills: ? A pill that contains high doses of estrogen and  progesterone. ? A pill that contains only progesterone. This may be a single pill or two pills taken 12-24 hours apart.  A non-hormonal pill that works by preventing progesterone from having its normal effect on ovulation and the lining of the uterus (endometrium). A prescription for this medicine is usually required.  A non-hormonal medical device that is inserted into the uterus (copper intrauterine device, IUD). The copper in the IUD causes the sperm to be less able to fertilize the egg. A health care provider must insert the IUD. Most types of emergency contraceptives are available without a prescription or a visit with your health care provider. If you are younger than 39, you may need a prescription. Talk with your pharmacist about your options. Side effects Ask your health care provider about the possible side effects of emergency contraceptives. Side effects may include:  Abdominal pain and cramps.  Nausea and vomiting.  Breast tenderness.  Headache.  Dizziness.  Fatigue.  Irregular bleeding or spotting. If you take emergency contraception while you are pregnant, it will not end your pregnancy or harm your baby. Follow these instructions at home:  Take over-the-counter and prescription medicines only as told by your health care provider.  Eat something before taking the emergency contraception pills. This can help prevent nausea.  If you feel tired or dizzy, rest until you feel better.  Continue using your normal method of birth control. Contact a health care provider if:  You vomit within 2 hours after taking a pill. You may need to take another pill.  You have a severe headache.  You have bleeding  that does not stop.  It has been 21 days since you took an emergency contraception pill and you have not had a menstrual period. Get help right away if:  You have chest pain.  You have leg pain.  You have numbness or weakness of your arms or legs.  You have slurred speech.  You have visual problems. Summary  Emergency contraceptives prevent pregnancy after unprotected sex.  Emergency contraception will not work if you are already pregnant and will not harm the baby if you are pregnant.  Some emergency contraceptives are available from your local pharmacy without a prescription.  Talk to your health care provider about the type of emergency contraceptives that are best for you. This information is not intended to replace advice given to you by your health care provider. Make sure you discuss any questions you have with your health care provider. Document Revised: 06/30/2017 Document Reviewed: 08/30/2016 Elsevier Patient Education  2020 [employer].

## 2020-05-25 ENCOUNTER — Other Ambulatory Visit: Payer: Self-pay

## 2020-05-25 ENCOUNTER — Encounter: Payer: Self-pay | Admitting: Physician Assistant

## 2020-05-25 ENCOUNTER — Ambulatory Visit: Payer: Medicaid Other

## 2020-05-25 ENCOUNTER — Ambulatory Visit (LOCAL_COMMUNITY_HEALTH_CENTER): Payer: Medicaid Other | Admitting: Physician Assistant

## 2020-05-25 VITALS — BP 184/112 | Ht 62.0 in | Wt 179.0 lb

## 2020-05-25 DIAGNOSIS — Z5321 Procedure and treatment not carried out due to patient leaving prior to being seen by health care provider: Secondary | ICD-10-CM

## 2020-05-25 NOTE — Progress Notes (Signed)
Pt is here for physical. BP 184/112. Pt denies any chest pain, pt states she feels fine just a little bit of a headache when she moves her eyes from side to side. Sadie Haber, PA made aware. At first pt desired to continue appt but later decided she wanted to go and take her BP medication. Repeat BP 151/108, pt still reports that she feels fine she is just concerned about her BP being up. Sadie Haber, PA made aware. Counseled pt per Sadie Haber, PA recommendations for pt to go home to take her BP medication and monitor BP to make sure it comes down and follow up with PCP, but that if pt develops any symptoms like headache, blurred vision, dizziness, chest pain, etc to go to ER/ call 911. Pt states understanding. Pt states that she will call to reschedule appt.

## 2020-05-25 NOTE — Progress Notes (Signed)
CNA reported patient having elevated BP to me.  I went into patient's room and asked if she were having any symptoms such as chest pain, nausea, blurry vision or headaches which patient denies.  Counseled patient that she should take her medications every day and that if she starts to have any symptoms, to notify us so that we can get her to the ER.  Patient states that she wants to stay for her appointment since she is already here.  After RN talked with patient during interview, patient opted to leave and go and take her medicine and will reschedule for her physical.

## 2020-06-01 ENCOUNTER — Other Ambulatory Visit: Payer: Self-pay

## 2020-06-01 ENCOUNTER — Ambulatory Visit: Payer: Medicaid Other

## 2020-06-01 ENCOUNTER — Ambulatory Visit (LOCAL_COMMUNITY_HEALTH_CENTER): Payer: Medicaid Other | Admitting: Family Medicine

## 2020-06-01 ENCOUNTER — Encounter: Payer: Self-pay | Admitting: Family Medicine

## 2020-06-01 VITALS — BP 110/79 | Ht 62.0 in | Wt 179.6 lb

## 2020-06-01 DIAGNOSIS — E669 Obesity, unspecified: Secondary | ICD-10-CM | POA: Insufficient documentation

## 2020-06-01 DIAGNOSIS — Z3009 Encounter for other general counseling and advice on contraception: Secondary | ICD-10-CM | POA: Diagnosis not present

## 2020-06-01 DIAGNOSIS — Z789 Other specified health status: Secondary | ICD-10-CM

## 2020-06-01 DIAGNOSIS — E6609 Other obesity due to excess calories: Secondary | ICD-10-CM | POA: Diagnosis not present

## 2020-06-01 DIAGNOSIS — Z720 Tobacco use: Secondary | ICD-10-CM

## 2020-06-01 DIAGNOSIS — Z30011 Encounter for initial prescription of contraceptive pills: Secondary | ICD-10-CM | POA: Diagnosis not present

## 2020-06-01 DIAGNOSIS — Z7251 High risk heterosexual behavior: Secondary | ICD-10-CM

## 2020-06-01 DIAGNOSIS — I10 Essential (primary) hypertension: Secondary | ICD-10-CM | POA: Diagnosis not present

## 2020-06-01 MED ORDER — ULIPRISTAL ACETATE 30 MG PO TABS: 1.0000 | ORAL_TABLET | Freq: Once | ORAL | 0 refills | Status: DC

## 2020-06-01 MED ORDER — ULIPRISTAL ACETATE 30 MG PO TABS: 1.0000 | ORAL_TABLET | Freq: Once | ORAL | 0 refills | Status: AC

## 2020-06-01 MED ORDER — NORETHINDRONE 0.35 MG PO TABS: 1.0000 | ORAL_TABLET | Freq: Every day | ORAL | 11 refills | Status: DC

## 2020-06-01 MED ORDER — LEVONORGESTREL 1.5 MG PO TABS: 1.5000 mg | ORAL_TABLET | Freq: Once | ORAL | 0 refills | Status: DC

## 2020-06-01 NOTE — Progress Notes (Signed)
ECP consent reviewed and signed. Sherri Brady dispensed from [facility] pharmacy per verbal order by Dr. Lyndel Safe. Provider orders completed.

## 2020-06-01 NOTE — Progress Notes (Signed)
[facility] DEPARTMENT [facility] [address] Main Number: [phone]   Family Planning Visit- Initial Visit  Subjective:  Sherri Brady is a 34 y.o.  [id number]   being seen today for an initial well woman visit and to discuss family planning options.  She is currently using Progesterone only pills for pregnancy prevention. Patient reports she does not want a pregnancy in the next year.  Patient has the following medical conditions has Encounter for care or examination of lactating mother; Obesity; and Tobacco use on their problem list.  Chief Complaint  Patient presents with  . Annual Exam  . Contraception    Patient reports no complaints, recently has PP exam at [facility]. Having sex with intermittent condom use. Last sex was 2 nights ago without a condom. Reports two periods in Oct which is not typical for her.  She reports trouble with medicaid covering her OCP that was prescribed by [facility].  Has 61 month old at home. Formula feeding only.     Body mass index is 32.85 kg/m. - Patient is eligible for diabetes screening based on BMI and age >1?  no HA1C ordered? not applicable  Patient reports 1 of partners in last year. Desires STI screening?  No - declines  Has patient been screened once for HCV in the past?  NO-declines blood work today. Counseled about recommendation for 1 time screening.   No results found for: HCVAB  Does the patient have current drug use (including MJ), have a partner with drug use, and/or has been incarcerated since last result? No  If yes-- Screen for HCV through [facility]   Does the patient meet criteria for HBV testing? No  Criteria:  -Household, sexual or needle sharing contact with HBV -History of drug use -HIV positive -Those with known Hep C   Health Maintenance Due  Topic Date Due  . Hepatitis C Screening  Never done  . COVID-19 Vaccine (1) Never done    Review of Systems  Constitutional: Negative  for chills and fever.  Eyes: Negative for blurred vision and double vision.  Respiratory: Negative for cough and shortness of breath.   Cardiovascular: Negative for chest pain and orthopnea.  Gastrointestinal: Negative for nausea and vomiting.  Genitourinary: Negative for dysuria, flank pain and frequency.  Musculoskeletal: Negative for myalgias.  Skin: Negative for rash.  Neurological: Negative for dizziness, tingling, weakness and headaches.  Endo/Heme/Allergies: Does not bruise/bleed easily.  Psychiatric/Behavioral: Negative for depression and suicidal ideas. The patient is not nervous/anxious.     The following portions of the patient's history were reviewed and updated as appropriate: allergies, current medications, past family history, past medical history, past social history, past surgical history and problem list. Problem list updated.   See flowsheet for other program required questions.  Objective:   Vitals:   06/01/20 0930 06/01/20 0947  BP: (!) 126/91 110/79  Weight: 179 lb 9.6 oz (81.5 kg)   Height: 5\' 2"  (1.575 m)     Physical Exam Constitutional:      Appearance: Normal appearance.  HENT:     Head: Normocephalic and atraumatic.  Pulmonary:     Effort: Pulmonary effort is normal.  Abdominal:     Palpations: Abdomen is soft.  Musculoskeletal:        General: Normal range of motion.  Skin:    General: Skin is warm and dry.  Neurological:     General: No focal deficit present.     Mental  Status: She is alert.  Psychiatric:        Mood and Affect: Mood normal.        Behavior: Behavior normal.       Assessment and Plan:  Sherri Brady is a 34 y.o. female presenting to the [facility] Department for an initial well woman exam/family planning visit  Contraception counseling: Reviewed all forms of birth control options in the tiered based approach. available including abstinence; over the counter/barrier methods; hormonal contraceptive  medication including pill, patch, ring, injection,contraceptive implant, ECP; hormonal and nonhormonal IUDs; permanent sterilization options including vasectomy and the various tubal sterilization modalities. Risks, benefits, and typical effectiveness rates were reviewed.  Questions were answered.  Written information was also given to the patient to review.  Patient desires POP, this was prescribed for patient. She will follow up in 1 yr  for surveillance.  She was told to call with any further questions, or with any concerns about this method of contraception.  Emphasized use of condoms 100% of the time for STI prevention.  Patient was offered ECP. ECP was accepted by the patient. ECP counseling was given - see RN documentation. Patient declined sending in Rx to pharmacy and feels she will not pick up medication. She will pick up birth control pills.   1. Encounter for initial prescription of contraceptive pills Counseled patient on risk of pregnancy given intermittent pill use and condom use. Patient is formula feeding currently  2. Encounter for other general counseling or advice on contraception Accepts ECP today Counseled to abstain or use condoms with all sex for 2 weeks and take home urine pregnancy test. If negative, start POP and back up with condoms  3. Essential hypertension On Labetalol  4. Class 1 obesity due to excess calories without serious comorbidity in adult, unspecified BMI Will use ELLA  5. Tobacco use Continues to smoke, not interested in cessation at this time.    COVID-19 Vaccine Counseling: The patient was counseled on the potential benefits and lack of known risks of COVID vaccination during today's visit. The patient's questions and concerns were addressed today, including safety of the vaccination and potential side effects as they have been published by ACOG and [facility]. The patient is currently 2 months postpartum and is not breastfeeding. The patient has been  informed that there have not been any documented vaccine related injuries, deaths or birth defects to infant or mom after receiving the COVID-19 vaccine to date. We discussed the data for children as the patient has several children at home all attending in person school.   All patient questions were addressed during our visit today. The patient is still unsure of her decision for vaccination.   Reviewed [facility] open access COVID vaccination clinic-- with ability to make appointments M-F 8-5PM    Return in about 1 year (around 06/01/2021) for Yearly wellness exam.  Future Appointments  Date Time Provider Department Center  08/10/2020  8:50 AM Schuman, Jaquelyn Bitter, MD [facility] None    Federico Flake, MD

## 2020-06-01 NOTE — Progress Notes (Signed)
Pt states she is here because her pharmacy states that the OCP that was prescribed by [facility] on 05/08/20 is not being covered by Medicaid, but Medicaid is covering her BP medicine (pt had PP exam at [facility] on 05/08/20). Last cigarette was right before walking into [facility] for appt.

## 2020-08-10 ENCOUNTER — Ambulatory Visit: Payer: Medicaid Other | Admitting: Obstetrics and Gynecology

## 2021-02-25 ENCOUNTER — Other Ambulatory Visit: Payer: Self-pay | Admitting: Obstetrics and Gynecology

## 2021-02-25 DIAGNOSIS — Z30011 Encounter for initial prescription of contraceptive pills: Secondary | ICD-10-CM

## 2021-04-06 ENCOUNTER — Telehealth: Payer: Self-pay

## 2021-04-06 DIAGNOSIS — Z30011 Encounter for initial prescription of contraceptive pills: Secondary | ICD-10-CM

## 2021-04-06 MED ORDER — NORETHINDRONE 0.35 MG PO TABS
1.0000 | ORAL_TABLET | Freq: Every day | ORAL | 0 refills | Status: DC
Start: 2021-04-06 — End: 2021-05-04

## 2021-04-06 NOTE — Telephone Encounter (Signed)
Pt needs a call to schedule her annual with schuman

## 2021-05-04 ENCOUNTER — Encounter: Payer: Self-pay | Admitting: Obstetrics and Gynecology

## 2021-05-04 ENCOUNTER — Other Ambulatory Visit: Payer: Self-pay

## 2021-05-04 ENCOUNTER — Ambulatory Visit (INDEPENDENT_AMBULATORY_CARE_PROVIDER_SITE_OTHER): Payer: Medicaid Other | Admitting: Obstetrics and Gynecology

## 2021-05-04 ENCOUNTER — Other Ambulatory Visit (HOSPITAL_COMMUNITY)
Admission: RE | Admit: 2021-05-04 | Discharge: 2021-05-04 | Disposition: A | Discharge status: Home / Self Care | Payer: Medicaid Other | Source: Ambulatory Visit | Attending: Obstetrics and Gynecology | Admitting: Obstetrics and Gynecology

## 2021-05-04 VITALS — BP 130/70 | Ht 62.0 in | Wt 168.0 lb

## 2021-05-04 DIAGNOSIS — Z124 Encounter for screening for malignant neoplasm of cervix: Secondary | ICD-10-CM | POA: Diagnosis not present

## 2021-05-04 DIAGNOSIS — Z1239 Encounter for other screening for malignant neoplasm of breast: Secondary | ICD-10-CM | POA: Diagnosis not present

## 2021-05-04 DIAGNOSIS — R011 Cardiac murmur, unspecified: Secondary | ICD-10-CM

## 2021-05-04 DIAGNOSIS — Z0000 Encounter for general adult medical examination without abnormal findings: Secondary | ICD-10-CM | POA: Diagnosis not present

## 2021-05-04 DIAGNOSIS — I10 Essential (primary) hypertension: Secondary | ICD-10-CM

## 2021-05-04 DIAGNOSIS — Z3041 Encounter for surveillance of contraceptive pills: Secondary | ICD-10-CM | POA: Diagnosis not present

## 2021-05-04 DIAGNOSIS — Z01419 Encounter for gynecological examination (general) (routine) without abnormal findings: Secondary | ICD-10-CM

## 2021-05-04 DIAGNOSIS — Z30011 Encounter for initial prescription of contraceptive pills: Secondary | ICD-10-CM

## 2021-05-04 MED ORDER — NORETHINDRONE 0.35 MG PO TABS: 1.0000 | ORAL_TABLET | Freq: Every day | ORAL | 11 refills | Status: DC

## 2021-05-04 NOTE — Patient Instructions (Addendum)
Institute of Medicine Recommended Dietary Allowances for Calcium and Vitamin D  Age (yr) Calcium Recommended Dietary Allowance (mg/day) Vitamin D Recommended Dietary Allowance (international units/day)  9-18 1,300 600  19-50 1,000 600  51-70 1,200 600  71 and older 1,200 800  Data from [employer]. Dietary reference intakes: calcium, vitamin D. [place], [place]: National Academies Press; 2011.   Exercising to Stay Healthy To become healthy and stay healthy, it is recommended that you do moderate-intensity and vigorous-intensity exercise. You can tell that you are exercising at a moderate intensity if your heart starts beating faster and you start breathing faster but can still hold a conversation. You can tell that you are exercising at a vigorous intensity if you are breathing much harder and faster and cannot hold a conversation while exercising. How can exercise benefit me? Exercising regularly is important. It has many health benefits, such as: Improving overall fitness, flexibility, and endurance. Increasing bone density. Helping with weight control. Decreasing body fat. Increasing muscle strength and endurance. Reducing stress and tension, anxiety, depression, or anger. Improving overall health. What guidelines should I follow while exercising? Before you start a new exercise program, talk with your health care provider. Do not exercise so much that you hurt yourself, feel dizzy, or get very short of breath. Wear comfortable clothes and wear shoes with good support. Drink plenty of water while you exercise to prevent dehydration or heat stroke. Work out until your breathing and your heartbeat get faster (moderate intensity). How often should I exercise? Choose an activity that you enjoy, and set realistic goals. Your health care provider can help you make an activity plan that is individually designed and works best for you. Exercise regularly as told by your health  care provider. This may include: Doing strength training two times a week, such as: Lifting weights. Using resistance bands. Push-ups. Sit-ups. Yoga. Doing a certain intensity of exercise for a given amount of time. Choose from these options: A total of 150 minutes of moderate-intensity exercise every week. A total of 75 minutes of vigorous-intensity exercise every week. A mix of moderate-intensity and vigorous-intensity exercise every week. Children, pregnant women, people who have not exercised regularly, people who are overweight, and older adults may need to talk with a health care provider about what activities are safe to perform. If you have a medical condition, be sure to talk with your health care provider before you start a new exercise program. What are some exercise ideas? Moderate-intensity exercise ideas include: Walking 1 mile (1.6 km) in about 15 minutes. Biking. Hiking. Golfing. Dancing. Water aerobics. Vigorous-intensity exercise ideas include: Walking 4.5 miles (7.2 km) or more in about 1 hour. Jogging or running 5 miles (8 km) in about 1 hour. Biking 10 miles (16.1 km) or more in about 1 hour. Lap swimming. Roller-skating or in-line skating. Cross-country skiing. Vigorous competitive sports, such as football, basketball, and soccer. Jumping rope. Aerobic dancing. What are some everyday activities that can help me get exercise? Yard work, such as: Pushing a lawn mower. Raking and bagging leaves. Washing your car. Pushing a stroller. Shoveling snow. Gardening. Washing windows or floors. How can I be more active in my day-to-day activities? Use stairs instead of an elevator. Take a walk during your lunch break. If you drive, park your car farther away from your work or school. If you take public transportation, get off one stop early and walk the rest of the way. Stand up or walk around during all of   your indoor phone calls. Get up, stretch, and walk  around every 30 minutes throughout the day. Enjoy exercise with a friend. Support to continue exercising will help you keep a regular routine of activity. Where to find more information You can find more information about exercising to stay healthy from: [place] Department of Health and Human Services: www.hhs.gov Centers for Disease Control and Prevention (CDC): www.cdc.gov Summary Exercising regularly is important. It will improve your overall fitness, flexibility, and endurance. Regular exercise will also improve your overall health. It can help you control your weight, reduce stress, and improve your bone density. Do not exercise so much that you hurt yourself, feel dizzy, or get very short of breath. Before you start a new exercise program, talk with your health care provider. This information is not intended to replace advice given to you by your health care provider. Make sure you discuss any questions you have with your health care provider. Document Revised: 11/13/2020 Document Reviewed: 11/13/2020 Elsevier Patient Education  2022 [employer]. Budget-Friendly Healthy Eating There are many ways to save money at the grocery store and continue to eat healthy. You can be successful if you: Plan meals according to your budget. Make a grocery list and only purchase food according to your grocery list. Prepare food yourself at home. What are tips for following this plan? Reading food labels Compare food labels between brand name foods and the store brand. Often the nutritional value is the same, but the store brand is lower cost. Look for products that do not have added sugar, fat, or salt (sodium). These often cost the same but are healthier for you. Products may be labeled as: Sugar-free. Nonfat. Low-fat. Sodium-free. Low-sodium. Look for lean ground beef labeled as at least 92% lean and 8% fat. Shopping  Buy only the items on your grocery list and go only to the areas of the store  that have the items on your list. Use coupons only for foods and brands you normally buy. Avoid buying items you wouldn't normally buy simply because they are on sale. Check online and in newspapers for weekly deals. Buy healthy items from the bulk bins when available, such as herbs, spices, flour, pasta, nuts, and dried fruit. Buy fruits and vegetables that are in season. Prices are usually lower on in-season produce. Look at the unit price on the price tag. Use it to compare different brands and sizes to find out which item is the best deal. Choose healthy items that are often low-cost, such as carrots, potatoes, apples, bananas, and oranges. Dried or canned beans are a low-cost protein source. Buy in bulk and freeze extra food. Items you can buy in bulk include meats, fish, poultry, frozen fruits, and frozen vegetables. Avoid buying "ready-to-eat" foods, such as pre-cut fruits and vegetables and pre-made salads. If possible, shop around to discover where you can find the best prices. Consider other retailers such as dollar stores, larger wholesale stores, local fruit and vegetable stands, and farmers markets. Do not shop when you are hungry. If you shop while hungry, it may be hard to stick to your list and budget. Resist impulse buying. Use your grocery list as your official plan for the week. Buy a variety of vegetables and fruits by purchasing fresh, frozen, and canned items. Look at the top and bottom shelves for deals. Foods at eye level (eye level of an adult or child) are usually more expensive. Be efficient with your time when shopping. The more time you   spend at the store, the more money you are likely to spend. To save money when choosing more expensive foods like meats and dairy: Choose cheaper cuts of meat, such as bone-in chicken thighs and drumsticks instead of skinless and boneless chicken. When you are ready to prepare the chicken, you can remove the skin yourself to make it  healthier. Choose lean meats like chicken or turkey instead of beef. Choose canned seafood, such as tuna, salmon, or sardines. Buy eggs as a low-cost source of protein. Buy dried beans and peas, such as lentils, split peas, or kidney beans instead of meats. Dried beans and peas are a good alternative source of protein. Buy the larger tubs of yogurt instead of individual-sized containers. Choose water instead of sodas and other sweetened beverages. Avoid buying chips, cookies, and other "junk food." These items are usually expensive and not healthy. Cooking Make extra food and freeze the extras in meal-sized containers or in individual portions for fast meals and snacks. Pre-cook on days when you have extra time to prepare meals in advance. You can keep these meals in the fridge or freezer and reheat for a quick meal. When you come home from the grocery store, wash, peel, and cut fruits and vegetables so they are ready to use and eat. This will help reduce food waste. Meal planning Do not eat out or get fast food. Prepare food at home. Make a grocery list and make sure to bring it with you to the store. If you have a smart phone, you could use your phone to create your shopping list. Plan meals and snacks according to a grocery list and budget you create. Use leftovers in your meal plan for the week. Look for recipes where you can cook once and make enough food for two meals. Prepare budget-friendly types of meals like stews, casseroles, and stir-fry dishes. Try some meatless meals or try "no cook" meals like salads. Make sure that half your plate is filled with fruits or vegetables. Choose from fresh, frozen, or canned fruits and vegetables. If eating canned, remember to rinse them before eating. This will remove any excess salt added for packaging. Summary Eating healthy on a budget is possible if you plan your meals according to your budget, purchase according to your budget and grocery list,  and prepare food yourself. Tips for buying more food on a limited budget include buying generic brands, using coupons only for foods you normally buy, and buying healthy items from the bulk bins when available. Tips for buying cheaper food to replace expensive food include choosing cheaper, lean cuts of meat, and buying dried beans and peas. This information is not intended to replace advice given to you by your health care provider. Make sure you discuss any questions you have with your health care provider. Document Revised: 04/30/2020 Document Reviewed: 04/30/2020 Elsevier Patient Education  2022 [employer] Inc. Bone Health Bones protect organs, store calcium, anchor muscles, and support the whole body. Keeping your bones strong is important, especially as you get older. You can take actions to help keep your bones strong and healthy. Why is keeping my bones healthy important? Keeping your bones healthy is important because your body constantly replaces bone cells. Cells get old, and new cells take their place. As we age, we lose bone cells because the body may not be able to make enough new cells to replace the old cells. The amount of bone cells and bone tissue you have is referred to as   bone mass. The higher your bone mass, the stronger your bones. The aging process leads to an overall loss of bone mass in the body, which can increase the likelihood of: Joint pain and stiffness. Broken bones. A condition in which the bones become weak and brittle (osteoporosis). A large decline in bone mass occurs in older adults. In women, it occurs about the time of menopause. What actions can I take to keep my bones healthy? Good health habits are important for maintaining healthy bones. This includes eating nutritious foods and exercising regularly. To have healthy bones, you need to get enough of the right minerals and vitamins. Most nutrition experts recommend getting these nutrients from the foods that  you eat. In some cases, taking supplements may also be recommended. Doing certain types of exercise is also important for bone health. What are the nutritional recommendations for healthy bones? Eating a well-balanced diet with plenty of calcium and vitamin D will help to protect your bones. Nutritional recommendations vary from person to person. Ask your health care provider what is healthy for you. Here are some general guidelines. Get enough calcium Calcium is the most important (essential) mineral for bone health. Most people can get enough calcium from their diet, but supplements may be recommended for people who are at risk for osteoporosis. Good sources of calcium include: Dairy products, such as low-fat or nonfat milk, cheese, and yogurt. Dark green leafy vegetables, such as bok choy and broccoli. Calcium-fortified foods, such as orange juice, cereal, bread, soy beverages, and tofu products. Nuts, such as almonds. Follow these recommended amounts for daily calcium intake: Children, age 1-3: 700 mg. Children, age 4-8: 1,000 mg. Children, age 9-13: 1,300 mg. Teens, age 14-18: 1,300 mg. Adults, age 19-50: 1,000 mg. Adults, age 51-70: Men: 1,000 mg. Women: 1,200 mg. Adults, age 71 or older: 1,200 mg. Pregnant and breastfeeding females: Teens: 1,300 mg. Adults: 1,000 mg. Get enough vitamin D Vitamin D is the most essential vitamin for bone health. It helps the body absorb calcium. Sunlight stimulates the skin to make vitamin D, so be sure to get enough sunlight. If you live in a cold climate or you do not get outside often, your health care provider may recommend that you take vitamin D supplements. Good sources of vitamin D in your diet include: Egg yolks. Saltwater fish. Milk and cereal fortified with vitamin D. Follow these recommended amounts for daily vitamin D intake: Children and teens, age 1-18: 600 international units. Adults, age 50 or younger: 400-800 international  units. Adults, age 51 or older: 800-1,000 international units. Get other important nutrients Other nutrients that are important for bone health include: Phosphorus. This mineral is found in meat, poultry, dairy foods, nuts, and legumes. The recommended daily intake for adult men and adult women is 700 mg. Magnesium. This mineral is found in seeds, nuts, dark green vegetables, and legumes. The recommended daily intake for adult men is 400-420 mg. For adult women, it is 310-320 mg. Vitamin K. This vitamin is found in green leafy vegetables. The recommended daily intake is 120 mg for adult men and 90 mg for adult women. What type of physical activity is best for building and maintaining healthy bones? Weight-bearing and strength-building activities are important for building and maintaining healthy bones. Weight-bearing activities cause muscles and bones to work against gravity. Strength-building activities increase the strength of the muscles that support bones. Weight-bearing and muscle-building activities include: Walking and hiking. Jogging and running. Dancing. Gym exercises. Lifting weights. Tennis   and racquetball. Climbing stairs. Aerobics. Adults should get at least 30 minutes of moderate physical activity on most days. Children should get at least 60 minutes of moderate physical activity on most days. Ask your health care provider what type of exercise is best for you. How can I find out if my bone mass is low? Bone mass can be measured with an X-ray test called a bone mineral density (BMD) test. This test is recommended for all women who are age 65 or older. It may also be recommended for: Men who are age 70 or older. People who are at risk for osteoporosis because of: Having bones that break easily. Having a long-term disease that weakens bones, such as kidney disease or rheumatoid arthritis. Having menopause earlier than normal. Taking medicine that weakens bones, such as steroids,  thyroid hormones, or hormone treatment for breast cancer or prostate cancer. Smoking. Drinking three or more alcoholic drinks a day. If you find that you have a low bone mass, you may be able to prevent osteoporosis or further bone loss by changing your diet and lifestyle. Where can I find more information? For more information, check out the following websites: National Osteoporosis Foundation: www.nof.org/patients National Institutes of Health: www.bones.nih.gov International Osteoporosis Foundation: www.iofbonehealth.org Summary The aging process leads to an overall loss of bone mass in the body, which can increase the likelihood of broken bones and osteoporosis. Eating a well-balanced diet with plenty of calcium and vitamin D will help to protect your bones. Weight-bearing and strength-building activities are also important for building and maintaining strong bones. Bone mass can be measured with an X-ray test called a bone mineral density (BMD) test. This information is not intended to replace advice given to you by your health care provider. Make sure you discuss any questions you have with your health care provider. Document Revised: 08/14/2017 Document Reviewed: 08/14/2017 Elsevier Patient Education  2022 Else[employer] Inc. Hypertension, Adult High blood pressure (hypertension) is when the force of blood pumping through the arteries is too strong. The arteries are the blood vessels that carry blood from the heart throughout the body. Hypertension forces the heart to work harder to pump blood and may cause arteries to become narrow or stiff. Untreated or uncontrolled hypertension can cause a heart attack, heart failure, a stroke, kidney disease, and other problems. A blood pressure reading consists of a higher number over a lower number. Ideally, your blood pressure should be below 120/80. The first ("top") number is called the systolic pressure. It is a measure of the pressure in your arteries  as your heart beats. The second ("bottom") number is called the diastolic pressure. It is a measure of the pressure in your arteries as the heart relaxes. What are the causes? The exact cause of this condition is not known. There are some conditions that result in or are related to high blood pressure. What increases the risk? Some risk factors for high blood pressure are under your control. The following factors may make you more likely to develop this condition: Smoking. Having type 2 diabetes mellitus, high cholesterol, or both. Not getting enough exercise or physical activity. Being overweight. Having too much fat, sugar, calories, or salt (sodium) in your diet. Drinking too much alcohol. Some risk factors for high blood pressure may be difficult or impossible to change. Some of these factors include: Having chronic kidney disease. Having a family history of high blood pressure. Age. Risk increases with age. Race. You may be at higher risk if   you are African American. Gender. Men are at higher risk than women before age 45. After age 65, women are at higher risk than men. Having obstructive sleep apnea. Stress. What are the signs or symptoms? High blood pressure may not cause symptoms. Very high blood pressure (hypertensive crisis) may cause: Headache. Anxiety. Shortness of breath. Nosebleed. Nausea and vomiting. Vision changes. Severe chest pain. Seizures. How is this diagnosed? This condition is diagnosed by measuring your blood pressure while you are seated, with your arm resting on a flat surface, your legs uncrossed, and your feet flat on the floor. The cuff of the blood pressure monitor will be placed directly against the skin of your upper arm at the level of your heart. It should be measured at least twice using the same arm. Certain conditions can cause a difference in blood pressure between your right and left arms. Certain factors can cause blood pressure readings to be  lower or higher than normal for a short period of time: When your blood pressure is higher when you are in a health care provider's office than when you are at home, this is called white coat hypertension. Most people with this condition do not need medicines. When your blood pressure is higher at home than when you are in a health care provider's office, this is called masked hypertension. Most people with this condition may need medicines to control blood pressure. If you have a high blood pressure reading during one visit or you have normal blood pressure with other risk factors, you may be asked to: Return on a different day to have your blood pressure checked again. Monitor your blood pressure at home for 1 week or longer. If you are diagnosed with hypertension, you may have other blood or imaging tests to help your health care provider understand your overall risk for other conditions. How is this treated? This condition is treated by making healthy lifestyle changes, such as eating healthy foods, exercising more, and reducing your alcohol intake. Your health care provider may prescribe medicine if lifestyle changes are not enough to get your blood pressure under control, and if: Your systolic blood pressure is above 130. Your diastolic blood pressure is above 80. Your personal target blood pressure may vary depending on your medical conditions, your age, and other factors. Follow these instructions at home: Eating and drinking  Eat a diet that is high in fiber and potassium, and low in sodium, added sugar, and fat. An example eating plan is called the DASH (Dietary Approaches to Stop Hypertension) diet. To eat this way: Eat plenty of fresh fruits and vegetables. Try to fill one half of your plate at each meal with fruits and vegetables. Eat whole grains, such as whole-wheat pasta, brown rice, or whole-grain bread. Fill about one fourth of your plate with whole grains. Eat or drink low-fat  dairy products, such as skim milk or low-fat yogurt. Avoid fatty cuts of meat, processed or cured meats, and poultry with skin. Fill about one fourth of your plate with lean proteins, such as fish, chicken without skin, beans, eggs, or tofu. Avoid pre-made and processed foods. These tend to be higher in sodium, added sugar, and fat. Reduce your daily sodium intake. Most people with hypertension should eat less than 1,500 mg of sodium a day. Do not drink alcohol if: Your health care provider tells you not to drink. You are pregnant, may be pregnant, or are planning to become pregnant. If you drink alcohol: Limit how   much you use to: 0-1 drink a day for women. 0-2 drinks a day for men. Be aware of how much alcohol is in your drink. In the [place] one drink equals one 12 oz bottle of beer (355 mL), one 5 oz glass of wine (148 mL), or one 1 oz glass of hard liquor (44 mL). Lifestyle  Work with your health care provider to maintain a healthy body weight or to lose weight. Ask what an ideal weight is for you. Get at least 30 minutes of exercise most days of the week. Activities may include walking, swimming, or biking. Include exercise to strengthen your muscles (resistance exercise), such as Pilates or lifting weights, as part of your weekly exercise routine. Try to do these types of exercises for 30 minutes at least 3 days a week. Do not use any products that contain nicotine or tobacco, such as cigarettes, e-cigarettes, and chewing tobacco. If you need help quitting, ask your health care provider. Monitor your blood pressure at home as told by your health care provider. Keep all follow-up visits as told by your health care provider. This is important. Medicines Take over-the-counter and prescription medicines only as told by your health care provider. Follow directions carefully. Blood pressure medicines must be taken as prescribed. Do not skip doses of blood pressure medicine. Doing this puts you  at risk for problems and can make the medicine less effective. Ask your health care provider about side effects or reactions to medicines that you should watch for. Contact a health care provider if you: Think you are having a reaction to a medicine you are taking. Have headaches that keep coming back (recurring). Feel dizzy. Have swelling in your ankles. Have trouble with your vision. Get help right away if you: Develop a severe headache or confusion. Have unusual weakness or numbness. Feel faint. Have severe pain in your chest or abdomen. Vomit repeatedly. Have trouble breathing. Summary Hypertension is when the force of blood pumping through your arteries is too strong. If this condition is not controlled, it may put you at risk for serious complications. Your personal target blood pressure may vary depending on your medical conditions, your age, and other factors. For most people, a normal blood pressure is less than 120/80. Hypertension is treated with lifestyle changes, medicines, or a combination of both. Lifestyle changes include losing weight, eating a healthy, low-sodium diet, exercising more, and limiting alcohol. This information is not intended to replace advice given to you by your health care provider. Make sure you discuss any questions you have with your health care provider. Document Revised: 03/28/2018 Document Reviewed: 03/28/2018 Elsevier Patient Education  2022 Elsevier Inc.  

## 2021-05-04 NOTE — Progress Notes (Signed)
Gynecology Annual Exam  PCP: Patient, No Pcp Per (Inactive)  Chief Complaint:  Chief Complaint  Patient presents with   Gynecologic Exam    History of Present Illness: Patient is a 35 y.o. [id number] presents for annual exam. The patient has no complaints today.   LMP: Patient's last menstrual period was 04/12/2021. Average Interval: monthly Duration of flow: 7 days Heavy Menses: no Dysmenorrhea: no  She denies passage of large clots She denies sensations of gushing or flooding of blood. She sometimes had accidents where she bleeds through her clothing. She denies that she changes a saturated pad or tampon more frequently than every hour.  She denies that pain from her periods limits her activities.  The patient does perform self breast exams.  There is no notable family history of breast or ovarian cancer in her family.  The patient reports her exercise generally consists of working.  The patient denies current symptoms of depression.   PHQ-9: 0 GAD-7: 0   Review of Systems: Review of Systems  Constitutional:  Negative for chills, fever, malaise/fatigue and weight loss.  HENT:  Negative for congestion, hearing loss and sinus pain.   Eyes:  Negative for blurred vision and double vision.  Respiratory:  Negative for cough, sputum production, shortness of breath and wheezing.   Cardiovascular:  Negative for chest pain, palpitations, orthopnea and leg swelling.  Gastrointestinal:  Negative for abdominal pain, constipation, diarrhea, nausea and vomiting.  Genitourinary:  Negative for dysuria, flank pain, frequency, hematuria and urgency.  Musculoskeletal:  Negative for back pain, falls and joint pain.  Skin:  Negative for itching and rash.  Neurological:  Negative for dizziness and headaches.  Psychiatric/Behavioral:  Negative for depression, substance abuse and suicidal ideas. The patient is not nervous/anxious.    Past Medical History:  Past Medical History:  Diagnosis  Date   Hypertension    Medical history non-contributory    Patient denies medical problems     Past Surgical History:  Past Surgical History:  Procedure Laterality Date   DILATION AND CURETTAGE OF UTERUS      Gynecologic History:  Patient's last menstrual period was 04/12/2021. Menarche: [age]  History of fibroids, polyps, or ovarian cysts? : no  History of PCOS? no Hstory of Endometriosis? no History of abnormal pap smears? no Have you had any sexually transmitted infections in the past? no  Last Pap: 2019 NIL   She identifies as a female. She is sexually active with men.   She denies dyspareunia. She denies postcoital bleeding.  She currently uses oral progesterone-only contraceptive for contraception.    Obstetric History: [id number]  Family History:  Family History  Problem Relation Age of Onset   Cataracts Mother    Arthritis Mother    Seizures Half-Brother    Alcohol abuse Neg Hx    Asthma Neg Hx    Birth defects Neg Hx    Cancer Neg Hx    COPD Neg Hx    Depression Neg Hx    Diabetes Neg Hx    Drug abuse Neg Hx    Early death Neg Hx    Hearing loss Neg Hx    Heart disease Neg Hx    Hyperlipidemia Neg Hx    Hypertension Neg Hx    Kidney disease Neg Hx    Learning disabilities Neg Hx    Mental illness Neg Hx    Mental retardation Neg Hx    Miscarriages / Stillbirths Neg Hx    Stroke  Neg Hx    Vision loss Neg Hx    Varicose Veins Neg Hx     Social History:  Social History   Socioeconomic History   Marital status: Single    Spouse name: Not on file   Number of children: Not on file   Years of education: Not on file   Highest education level: Not on file  Occupational History   Not on file  Tobacco Use   Smoking status: Every Day    Packs/day: 0.50    Types: Cigarettes   Smokeless tobacco: Never  Vaping Use   Vaping Use: Never used  Substance and Sexual Activity   Alcohol use: Yes    Comment: "sometimes a beer or two"   Drug use: Not  Currently    Types: Cocaine   Sexual activity: Yes    Birth control/protection: Condom    Comment: condoms sometimes  Other Topics Concern   Not on file  Social History Narrative   Not on file   Social Determinants of Health   Financial Resource Strain: Not on file  Food Insecurity: Not on file  Transportation Needs: Not on file  Physical Activity: Not on file  Stress: Not on file  Social Connections: Not on file  Intimate Partner Violence: Not At Risk   Fear of Current or Ex-Partner: No   Emotionally Abused: No   Physically Abused: No   Sexually Abused: No    Allergies:  No Known Allergies  Medications: Prior to Admission medications   Medication Sig Start Date End Date Taking? Authorizing Provider  AUROVELA FE 1/20 1-20 MG-MCG tablet TAKE 1 TABLET BY MOUTH DAILY Patient not taking: Reported on 05/04/2021 02/27/21   Natale Milch, MD  Biotin 89373 MCG TABS Take 1 tablet by mouth 1 day or 1 dose. Patient not taking: Reported on 05/04/2021    [provider]  labetalol (NORMODYNE) 200 MG tablet Take 1 tablet (200 mg total) by mouth 2 (two) times daily. Patient not taking: Reported on 05/04/2021 05/08/20   Natale Milch, MD  norethindrone (MICRONOR) 0.35 MG tablet Take 1 tablet (0.35 mg total) by mouth daily. Pharmacy my substitute any generic progesterone only contraceptive pill 05/04/21   Natale Milch, MD    Physical Exam Vitals: Blood pressure 130/70, height 5\' 2"  (1.575 m), weight 168 lb (76.2 kg), last menstrual period 04/12/2021, not currently breastfeeding.  OBGyn Exam   Female chaperone present for pelvic and breast  portions of the physical exam  Assessment: 35 y.o. 31 routine annual exam  Plan: Problem List Items Addressed This Visit   None Visit Diagnoses     Cervical cancer screening    -  Primary   Relevant Orders   Cytology - PAP   Encounter for annual routine gynecological examination       Health maintenance  examination       Encounter for gynecological examination without abnormal finding       Encounter for birth control pills maintenance       Encounter for screening breast examination       Systolic murmur       Relevant Orders   Ambulatory referral to Cardiology   Essential hypertension       Relevant Orders   Ambulatory referral to Cardiology   Encounter for initial prescription of contraceptive pills       Relevant Medications   norethindrone (MICRONOR) 0.35 MG tablet       1) STI screening  was offered and declined  2) Pap smear performed today  3) Contraception - desires to continue POP. Is considering Mirena IUD and may return for placement.   4) Discussed smoking cessation. Patient currently reports she has been vaping more and using cigarettes less. She is considering quitting, but does not feel ready  5) Reviewed history of HTN, patient not currently taking medication. Encouraged to follow up with PCP about this  6) Possible systolic murmur- will refer to cardiology.   Adelene Idler MD, [facility], [facility] 05/04/2021 2:05 PM

## 2021-05-06 LAB — CYTOLOGY - PAP
Comment: NEGATIVE
Diagnosis: NEGATIVE
High risk HPV: NEGATIVE

## 2021-06-09 ENCOUNTER — Telehealth: Payer: Self-pay

## 2021-06-09 ENCOUNTER — Ambulatory Visit: Payer: Medicaid Other | Admitting: Cardiovascular Disease

## 2021-06-09 NOTE — Telephone Encounter (Signed)
Pt calling for refill of bcp to be sent to [facility].  [phone]  Mailbox is full - has refills on file; needs to contact pharm.

## 2021-06-10 ENCOUNTER — Encounter: Payer: Self-pay | Admitting: Cardiovascular Disease

## 2021-06-10 NOTE — Telephone Encounter (Signed)
2nd try - mailbox is full.

## 2021-06-14 NOTE — Telephone Encounter (Signed)
Pt aware.

## 2021-09-29 ENCOUNTER — Other Ambulatory Visit: Payer: Self-pay

## 2022-04-11 ENCOUNTER — Telehealth: Payer: Self-pay

## 2022-04-11 NOTE — Telephone Encounter (Signed)
Patient requesting refill of  norethindrone (MICRONOR) 0.35 MG tablet  Advised needs to schedule Annual. Last annual 05/04/21.  Front desk unavailable. Patient advised she will call back to schedule. Patient aware refill will be sent after annual has been scheduled.

## 2022-04-15 NOTE — Telephone Encounter (Signed)
I contacted patient via phone. Left message for patient to call back to be scheduled. 

## 2022-04-25 ENCOUNTER — Telehealth: Payer: Self-pay | Admitting: Obstetrics & Gynecology

## 2022-04-25 ENCOUNTER — Ambulatory Visit: Payer: Medicaid Other

## 2022-04-25 NOTE — Telephone Encounter (Signed)
Patient is scheduled for 05/12/22 with CJE. Patient is requesting refill. Please advise?

## 2022-04-25 NOTE — Telephone Encounter (Signed)
Patient is scheduled to see CJE on 05/12/22 at 10:35. Left message for patient to call office back to reschedule.

## 2022-04-26 ENCOUNTER — Other Ambulatory Visit: Payer: Self-pay

## 2022-04-26 DIAGNOSIS — Z30011 Encounter for initial prescription of contraceptive pills: Secondary | ICD-10-CM

## 2022-04-26 MED ORDER — NORETHINDRONE 0.35 MG PO TABS: 1.0000 | ORAL_TABLET | Freq: Every day | ORAL | 0 refills | Status: DC

## 2022-04-26 NOTE — Telephone Encounter (Signed)
Rx refilled today (see separate telephone refill encounter)

## 2022-05-05 NOTE — Telephone Encounter (Signed)
Patient is schedule for 10/12 with AT

## 2022-05-12 ENCOUNTER — Ambulatory Visit: Payer: Self-pay | Admitting: Obstetrics & Gynecology

## 2022-05-12 ENCOUNTER — Ambulatory Visit: Payer: Medicaid Other | Admitting: Certified Nurse Midwife

## 2022-05-25 ENCOUNTER — Telehealth: Payer: Self-pay

## 2022-05-25 DIAGNOSIS — Z30011 Encounter for initial prescription of contraceptive pills: Secondary | ICD-10-CM

## 2022-05-25 NOTE — Telephone Encounter (Signed)
Request recv'd from [facility] in [place] for refill on norethindrone 0.35mg  tabs.  Pt needs appt.

## 2022-05-30 ENCOUNTER — Other Ambulatory Visit: Payer: Self-pay | Admitting: Certified Nurse Midwife

## 2022-05-30 DIAGNOSIS — Z30011 Encounter for initial prescription of contraceptive pills: Secondary | ICD-10-CM

## 2022-05-30 MED ORDER — NORETHINDRONE 0.35 MG PO TABS: 1.0000 | ORAL_TABLET | Freq: Every day | ORAL | 0 refills | Status: DC

## 2022-05-30 NOTE — Addendum Note (Signed)
Addended by: Cleophas Dunker D on: 05/30/2022 03:40 PM   Modules accepted: Orders

## 2022-05-30 NOTE — Telephone Encounter (Signed)
Pt is scheduled with Deneise Lever on 11/21 for her annual exam.

## 2022-06-21 ENCOUNTER — Ambulatory Visit: Payer: Medicaid Other | Admitting: Certified Nurse Midwife

## 2022-08-21 ENCOUNTER — Other Ambulatory Visit: Payer: Self-pay | Admitting: Certified Nurse Midwife

## 2022-08-21 DIAGNOSIS — Z30011 Encounter for initial prescription of contraceptive pills: Secondary | ICD-10-CM

## 2022-09-26 ENCOUNTER — Ambulatory Visit: Payer: Medicaid Other | Admitting: Family Medicine

## 2022-11-26 ENCOUNTER — Emergency Department: Payer: Medicaid Other

## 2022-11-26 ENCOUNTER — Emergency Department
Admission: EM | Admit: 2022-11-26 | Discharge: 2022-11-26 | Disposition: A | Discharge status: Home / Self Care | Payer: Medicaid Other | Attending: Emergency Medicine | Admitting: Emergency Medicine

## 2022-11-26 DIAGNOSIS — Y93E2 Activity, laundry: Secondary | ICD-10-CM | POA: Insufficient documentation

## 2022-11-26 DIAGNOSIS — S8001XA Contusion of right knee, initial encounter: Secondary | ICD-10-CM

## 2022-11-26 DIAGNOSIS — W109XXA Fall (on) (from) unspecified stairs and steps, initial encounter: Secondary | ICD-10-CM | POA: Diagnosis not present

## 2022-11-26 DIAGNOSIS — M25561 Pain in right knee: Secondary | ICD-10-CM | POA: Diagnosis present

## 2022-11-26 DIAGNOSIS — S8391XA Sprain of unspecified site of right knee, initial encounter: Secondary | ICD-10-CM | POA: Diagnosis not present

## 2022-11-26 NOTE — ED Triage Notes (Signed)
Pt presents to the ED due to a fall that happened toda. Pt states she was carrying laundry downstairs when she loss balance and fell down 16 steps. Pt denies LOC, NVD.pT a&oX4

## 2022-11-26 NOTE — ED Provider Notes (Signed)
[facility] Emergency Department Provider Note     Event Date/Time   First MD Initiated Contact with Patient 11/26/22 1841     (approximate)   History   Knee Pain   HPI  Sherri Brady is a 37 y.o. female presents to the ED for evaluation of a mechanical fall.  Patient was carrying laundry down the steps when she lost her balance, falling down about 16 steps.  She presents to the ED with acute left knee pain.  No head injury or LOC reported.    Physical Exam   Triage Vital Signs: ED Triage Vitals  Enc Vitals Group     BP 11/26/22 1804 (!) 161/114     Pulse Rate 11/26/22 1804 75     Resp 11/26/22 1804 18     Temp 11/26/22 1804 98 F (36.7 C)     Temp Source 11/26/22 1804 Oral     SpO2 11/26/22 1804 98 %     Weight 11/26/22 1806 167 lb 15.9 oz (76.2 kg)     Height --      Head Circumference --      Peak Flow --      Pain Score 11/26/22 1805 0     Pain Loc --      Pain Edu? --      Excl. in GC? --     Most recent vital signs: Vitals:   11/26/22 1804 11/26/22 1916  BP: (!) 161/114 (!) 150/99  Pulse: 75 76  Resp: 18 18  Temp: 98 F (36.7 C)   SpO2: 98% 99%    General Awake, no distress. NAD CV:  Good peripheral perfusion.  RESP:  Normal effort.  ABD:  No distention.  MSK:  Right knee without obvious deformity, dislocation, joint effusion.  Normal patella tracking is noted.  No medical specialists listed.  Mild on the palpation to the inferior patella tendon region.  No calf or Achilles and is noted distally.   ED Results / Procedures / Treatments   Labs (all labs ordered are listed, but only abnormal results are displayed) Labs Reviewed - No data to display   EKG  RADIOLOGY  I personally viewed and evaluated these images as part of my medical decision making, as well as reviewing the written report by the radiologist.  ED Provider Interpretation: No acute findings  DG Knee Complete 4 Views Right  Result Date:  11/26/2022 CLINICAL DATA:  Knee pain. EXAM: RIGHT KNEE - COMPLETE 4+ VIEW COMPARISON:  None Available. FINDINGS: No evidence of fracture, dislocation, or joint effusion. No evidence of arthropathy or other focal bone abnormality. Soft tissues are unremarkable. IMPRESSION: Negative. Electronically Signed   By: Ted Mcalpine M.D.   On: 11/26/2022 18:42     PROCEDURES:  Critical Care performed: No  Procedures   MEDICATIONS ORDERED IN ED: Medications - No data to display   IMPRESSION / MDM / ASSESSMENT AND PLAN / ED COURSE  I reviewed the triage vital signs and the nursing notes.                              Differential diagnosis includes, but is not limited to, contusion, knee sprain, bursitis, patellar fracture, dislocation  Patient's presentation is most consistent with acute complicated illness / injury requiring diagnostic workup.  Patient to the ED for evaluation of acute knee pain following mechanical fall.  She presents to the ED for  evaluation of her symptoms.  No reports of any other injury except for knee pain.  Patient's diagnosis is consistent with knee contusion and sprain.  Illogic evidence of any acute fracture or dislocation, based on my interpretation.  Will be placed in a Jones Watson wrap for comfort and support.  Patient will be discharged home with just take OTC Tylenol or Motrin for pain and inflammation. Patient is to follow up with orthopedics as needed or otherwise directed. Patient is given ED precautions to return to the ED for any worsening or new symptoms.  FINAL CLINICAL IMPRESSION(S) / ED DIAGNOSES   Final diagnoses:  Sprain of right knee, unspecified ligament, initial encounter  Contusion of right knee, initial encounter     Rx / DC Orders   ED Discharge Orders     None        Note:  This document was prepared using Dragon voice recognition software and may include unintentional dictation errors.    Lissa Hoard,  PA-C 11/26/22 2359    Minna Antis, MD 11/27/22 [phone]

## 2022-11-26 NOTE — Discharge Instructions (Signed)
Your exam and x-ray are consistent with a knee sprain and contusion.  No x-ray evidence of any fracture or dislocation.  Wear the Ace wrap as needed for comfort and support.  Apply ice to help reduce swelling.  Take OTC ibuprofen as needed for pain inflammation.  Follow-up with orthopedics for any ongoing knee pain.

## 2022-11-26 NOTE — ED Notes (Signed)
Pt verbalizes understanding of discharge instructions. Opportunity for questioning and answers were provided. Pt discharged from ED to home with significant other.   ? ?

## 2022-11-26 NOTE — ED Notes (Signed)
Watson jones splint applied to right knee per PA's instructions.

## 2022-11-28 ENCOUNTER — Telehealth: Payer: Self-pay | Admitting: Family Medicine

## 2022-11-28 NOTE — Telephone Encounter (Signed)
Pt notified to call [facility] for a refill due to them prescribing her OCP's.  Pt verbalizes understanding.

## 2022-11-28 NOTE — Telephone Encounter (Signed)
Please give me a call back I am down to my last Bc pills I have medicaid

## 2022-12-20 ENCOUNTER — Ambulatory Visit: Payer: Medicaid Other

## 2023-05-29 ENCOUNTER — Ambulatory Visit: Payer: Medicaid Other

## 2023-05-29 VITALS — BP 118/79 | Ht 61.0 in | Wt 175.5 lb

## 2023-05-29 DIAGNOSIS — Z30012 Encounter for prescription of emergency contraception: Secondary | ICD-10-CM | POA: Diagnosis not present

## 2023-05-29 DIAGNOSIS — Z309 Encounter for contraceptive management, unspecified: Secondary | ICD-10-CM

## 2023-05-29 MED ORDER — ELLA 30 MG PO TABS
1.0000 | ORAL_TABLET | Freq: Once | ORAL | 0 refills | Status: AC
Start: 2023-05-29 — End: 2023-05-29

## 2023-05-29 NOTE — Progress Notes (Signed)
In nurse clinic for ECP. Patient reports had abortion 05/24/2023 and currently on period (started 05/25/2023). Last sex 05/28/2023. Patient does not desire pregnancy. ECP consent signed and information sheet given to patient. ECP rx sent to patient's pharmacy.  Patient reports she would like to restart OCP and asked when it would be okay to restart. Consulted with Aliene Altes, FNP who advises that the patient can restart 05/30/2023. Patient agrees.   All questions answered and verbalizes understanding.   Abagail Kitchens, RN

## 2023-06-22 ENCOUNTER — Encounter: Payer: Self-pay | Admitting: Nurse Practitioner

## 2023-06-22 ENCOUNTER — Ambulatory Visit: Payer: Medicaid Other | Admitting: Family Medicine

## 2023-06-22 DIAGNOSIS — Z5321 Procedure and treatment not carried out due to patient leaving prior to being seen by health care provider: Secondary | ICD-10-CM | POA: Diagnosis not present

## 2023-06-22 DIAGNOSIS — Z30011 Encounter for initial prescription of contraceptive pills: Secondary | ICD-10-CM | POA: Diagnosis not present

## 2023-06-22 DIAGNOSIS — Z309 Encounter for contraceptive management, unspecified: Secondary | ICD-10-CM

## 2023-06-22 MED ORDER — NORETHINDRONE 0.35 MG PO TABS
1.0000 | ORAL_TABLET | Freq: Every day | ORAL | 0 refills | Status: DC
Start: 2023-06-22 — End: 2023-08-22

## 2023-06-22 NOTE — Progress Notes (Signed)
Patient here for OCPs. Last seen here by provider 06/01/2020. Last seen by nurse 05/29/23 for ECP; per that note, abortion 05/24/2023, LMP 05/25/23.   Patient needs a family planning physical. Looks like she was given one pack of POPs (micronor) at that nurse visit 10/28 and told to return for physical.   Patient reports she needs to leave for work and cannot wait to be seen by a provider.   We will send ONE pack of micronor to her pharmacy, however her next appointment will need to be a family planning physical or we will be unable to provide any more contraceptive until that is completed.   Given history of HTN and smoking, cannot receive COC.   Fayette Pho, MD 06/22/23  9:57 AM

## 2023-07-04 ENCOUNTER — Telehealth: Payer: Self-pay | Admitting: General Practice

## 2023-07-04 NOTE — Telephone Encounter (Signed)
Pt asked for bc pills be sent to pharmacy and pharmacy was told pills wont be ready till 25th. Pt wants to know how to get them sooner by pahrmacy, says shes out would like a call.

## 2023-08-22 ENCOUNTER — Ambulatory Visit: Payer: Medicaid Other | Admitting: Nurse Practitioner

## 2023-08-22 VITALS — BP 145/99 | HR 59 | Ht 62.0 in | Wt 175.2 lb

## 2023-08-22 DIAGNOSIS — Z01419 Encounter for gynecological examination (general) (routine) without abnormal findings: Secondary | ICD-10-CM | POA: Diagnosis not present

## 2023-08-22 DIAGNOSIS — Z3009 Encounter for other general counseling and advice on contraception: Secondary | ICD-10-CM | POA: Diagnosis not present

## 2023-08-22 MED ORDER — NORETHINDRONE 0.35 MG PO TABS: 1.0000 | ORAL_TABLET | Freq: Every day | ORAL | 12 refills | Status: DC

## 2023-08-22 NOTE — Progress Notes (Signed)
x

## 2023-08-22 NOTE — Progress Notes (Addendum)
[employer] [facility] [address] [place] [address] Main phone: [phone]  Family Planning Visit - Repeat Yearly Visit  Subjective:  Sherri Brady is a 38 y.o. [id number]  being seen today for an annual wellness visit and to discuss contraception options.   The patient is currently using Female Condom sometimes for pregnancy prevention. Patient does not want a pregnancy in the next year.   Patient reports they are looking for a method that provides High efficacy at preventing pregnancy  Patient has the following medical problems: has Encounter for care or examination of lactating mother; Obesity; and Tobacco use on their problem list.  Chief Complaint  Patient presents with   Contraception    Pt is here PE and BC   Annual Exam    PE/BC Pills    HPI  Patient indicates 76 female partner for the last 11 years. She endorses vaginal only sex with condom use sometimes. Pt reports last sexual encounter was 07/11/23. Patient reports a history of Chlamydia more than 11 years ago, but does not know exact date. She indicates her LMP was "either last week or week before." Patient is requesting POPs today and has been on them in the past with no ADE. Last pill patient reports taking was in November or December last year (1-2 months ago). Patient bp elevated in office today with 3 separate readings (145/99, 155/102, 142/102) last reading taken after patient had sat quietly for 30 minutes with no nicotene or caffeine. Patient reports when she was pregnant with her last child (2021) she had elevated bp and was given medication to take PP. She stated she did for a while and her BP go better so she stopped taking them. She is not currently on any medication for BP management.   Review of Systems  Constitutional:  Negative for weight loss.  Eyes:  Negative for blurred vision.  Respiratory:  Negative for cough and shortness of breath.    Cardiovascular:  Negative for claudication.  Gastrointestinal:  Negative for nausea.  Genitourinary:  Negative for dysuria and frequency.  Skin:  Negative for rash.  Neurological:  Negative for headaches.  Endo/Heme/Allergies:  Does not bruise/bleed easily.    See flowsheet for other program required questions.   Diabetes screening This patient is 38 y.o. with a BMI of Body mass index is 32.04 kg/m.Marland Kitchen  Is patient eligible for diabetes screening (age >35 and BMI >25)?  yes  Was Hgb A1c ordered? No-Patient declined.   STI screening Patient reports 1 of partners in last year.  Does this patient desire STI screening?  No - patient reports same partner for 11 years, reports being faithful, and believes her partner is also. Declined STI testing today.   Hepatitis C screening Has patient been screened once for HCV in the past?  No  No results found for: "HCVAB"  Does the patient meet criteria for HCV testing? Yes ; however patient declined.  (If yes-- Screen for HCV through [facility]) Criteria:  Since the last HCV result, does the patient have any of the following? - Current drug use - Have a partner with drug use - Has been incarcerated  Hepatitis B screening Does the patient meet criteria for HBV testing? No Criteria:  -Household, sexual or needle sharing contact with HBV -History of drug use -HIV positive -Those with known Hep C  Cervical Cancer Screening     Component Value Date/Time   DIAGPAP  05/04/2021  1403    - Negative for intraepithelial lesion or malignancy (NILM)   HPVHIGH Negative 05/04/2021 1403   ADEQPAP  05/04/2021 1403    Satisfactory for evaluation; transformation zone component PRESENT.   No results found for: "SPECADGYN" Result Date Procedure Results Follow-ups  05/04/2021 Cytology - PAP High risk HPV: Negative Adequacy: Satisfactory for evaluation; transformation zone component PRESENT. Diagnosis: - Negative for intraepithelial lesion or  malignancy (NILM) Comment: Normal Reference Range HPV - Negative   05/11/2018 HM PAP SMEAR HM Pap smear: Negative, hPV negative   09/12/2014 Pap IG, rfx HPV ASCU,16/18      Health Maintenance Due  Topic Date Due   Pneumococcal Vaccine 32-7 Years old (1 of 2 - PCV) Never done   Hepatitis C Screening  Never done   INFLUENZA VACCINE  Never done   COVID-19 Vaccine (1 - 2024-25 season) Never done    The following portions of the patient's history were reviewed and updated as appropriate: allergies, current medications, past family history, past medical history, past social history, past surgical history and problem list. Problem list updated.  Objective:   Vitals:   08/22/23 1000  BP: (!) 145/99  Pulse: (!) 59  Weight: 175 lb 3.2 oz (79.5 kg)  Height: 5\' 2"  (1.575 m)    Physical Exam Vitals and nursing note reviewed.  Constitutional:      Appearance: Normal appearance.  HENT:     Head: Normocephalic.     Salivary Glands: Right salivary gland is not diffusely enlarged or tender. Left salivary gland is not diffusely enlarged or tender.     Mouth/Throat:     Lips: Pink. No lesions.     Mouth: Mucous membranes are moist.     Tongue: No lesions. Tongue does not deviate from midline.     Pharynx: Oropharynx is clear. Uvula midline. No oropharyngeal exudate or posterior oropharyngeal erythema.     Tonsils: No tonsillar exudate.  Eyes:     General:        Right eye: No discharge.        Left eye: No discharge.  Neck:     Thyroid: No thyroid mass or thyroid tenderness.     Trachea: Trachea and phonation normal. No tracheal tenderness or tracheal deviation.  Cardiovascular:     Rate and Rhythm: Normal rate and regular rhythm.     Heart sounds: Normal heart sounds, S1 normal and S2 normal.  Pulmonary:     Effort: Pulmonary effort is normal.     Breath sounds: Normal breath sounds and air entry.  Chest:  Breasts:    Tanner Score is 5.     Breasts are symmetrical.     Right:  Normal. No swelling, bleeding, inverted nipple, mass, nipple discharge, skin change or tenderness.     Left: Normal. No swelling, bleeding, inverted nipple, mass, nipple discharge, skin change or tenderness.  Abdominal:     General: Abdomen is flat. Bowel sounds are normal. There is no distension.     Palpations: Abdomen is soft.     Tenderness: There is no abdominal tenderness. There is no guarding or rebound.  Genitourinary:    General: Normal vulva.     Exam position: Lithotomy position.     Pubic Area: No rash or pubic lice.      Tanner stage (genital): 5.     Labia:        Right: No rash, tenderness, lesion or injury.        Left: No  rash, tenderness, lesion or injury.      Vagina: No signs of injury and foreign body. Vaginal discharge present. No erythema, tenderness, bleeding or lesions.     Cervix: Normal. No cervical motion tenderness, discharge, friability, lesion, erythema, cervical bleeding or eversion.     Uterus: Normal.      Adnexa: Right adnexa normal and left adnexa normal.     Comments: Minimal amount of thin white vaginal discharge present. Patient has no concerns. Pt declined Wet prep and GC/Chl swabs.  Lymphadenopathy:     Head:     Right side of head: No submental, submandibular, tonsillar, preauricular or posterior auricular adenopathy.     Left side of head: No submental, submandibular, tonsillar, preauricular or posterior auricular adenopathy.     Cervical: No cervical adenopathy.     Right cervical: No superficial or posterior cervical adenopathy.    Left cervical: No superficial or posterior cervical adenopathy.     Upper Body:     Right upper body: No supraclavicular or axillary adenopathy.     Left upper body: No supraclavicular or axillary adenopathy.     Lower Body: No right inguinal adenopathy. No left inguinal adenopathy.  Skin:    General: Skin is warm and dry.     Findings: No lesion or rash.     Comments: Skin tone appropriate for ethnicity.    Neurological:     Mental Status: She is alert and oriented to person, place, and time.  Psychiatric:        Attention and Perception: Attention normal.        Mood and Affect: Mood normal.        Speech: Speech normal.        Behavior: Behavior normal. Behavior is cooperative.        Thought Content: Thought content normal.     Assessment and Plan:  Carmina Walle is a 38 y.o. female [phone] presenting to the [facility] Department for an yearly wellness and contraception visit  1. Family planning (Primary)  Patient has had a period since last sexual encounter so according to CDC guidelines this is enough to be reasonably sure patient is not pregnant; therefore, no UPT performed today.   Contraception counseling: Reviewed options based on patient desire and reproductive life plan. Patient is interested in Oral Contraceptive. This was provided to the patient today.  Risks, benefits, and typical effectiveness rates were reviewed.  Questions were answered.  Written information was also given to the patient to review.    The patient will follow up in  1 years for surveillance.  The patient was told to call with any further questions, or with any concerns about this method of contraception.  Emphasized use of condoms 100% of the time for STI prevention.  Educated on ECP and assessed need for ECP. Patient was NOT offered ECP based  on no unprotected sex within the last month.  - norethindrone (MICRONOR) 0.35 MG tablet; Take 1 tablet (0.35 mg total) by mouth daily.  Dispense: 28 tablet; Refill: 12  2. Well woman exam Patient given list of PCPs in the area to establish care with and follow-up regarding BP and Hgb A1C. Patient given red flag warning signs regarding BP that would require emergent medical attention. Patient agreed and verbalized understanding.    CBE performed in office today. Next due 1-3 years depending (08/22/24-08/22/26) per ACOG or ACS guidelines. Discussed need  for mammogram at age 41.  PAP due 05/05/2026.  Return  in about 1 year (around 08/21/2024) for annual well-woman exam.  No future appointments.  Edmonia James, NP

## 2023-08-22 NOTE — Progress Notes (Signed)
Patient is here for PE and BC pills. FP packet given to patient and contents reviewed with the patient. The patient was dispensed Micronor 0.35mg  daily. I provided counseling today regarding the medication. We discussed the medication, the side effects and when to call clinic. Patient given the opportunity to ask questions for any clarification. Condoms declined. Sonda Primes, RN.

## 2024-08-14 ENCOUNTER — Ambulatory Visit (INDEPENDENT_AMBULATORY_CARE_PROVIDER_SITE_OTHER): Admitting: Nurse Practitioner

## 2024-08-14 ENCOUNTER — Encounter: Payer: Self-pay | Admitting: Nurse Practitioner

## 2024-08-14 VITALS — BP 131/88 | HR 66 | Temp 98.0°F | Ht 62.6 in | Wt 181.2 lb

## 2024-08-14 DIAGNOSIS — M545 Low back pain, unspecified: Secondary | ICD-10-CM

## 2024-08-14 DIAGNOSIS — G8929 Other chronic pain: Secondary | ICD-10-CM | POA: Diagnosis not present

## 2024-08-14 DIAGNOSIS — Z7689 Persons encountering health services in other specified circumstances: Secondary | ICD-10-CM | POA: Diagnosis not present

## 2024-08-14 DIAGNOSIS — R0681 Apnea, not elsewhere classified: Secondary | ICD-10-CM | POA: Diagnosis not present

## 2024-08-14 DIAGNOSIS — H7291 Unspecified perforation of tympanic membrane, right ear: Secondary | ICD-10-CM

## 2024-08-14 MED ORDER — CYCLOBENZAPRINE HCL 5 MG PO TABS: 5.0000 mg | ORAL_TABLET | Freq: Three times a day (TID) | ORAL | 1 refills | Status: AC | PRN

## 2024-08-14 NOTE — Progress Notes (Signed)
 "  BP 131/88 (BP Location: Right Arm, Cuff Size: Normal)   Pulse 66   Temp 98 F (36.7 C) (Oral)   Ht 5' 2.6 (1.59 m)   Wt 181 lb 3.2 oz (82.2 kg)   LMP 08/13/2024 (Exact Date)   SpO2 99%   BMI 32.51 kg/m    Subjective:    Patient ID: Sherri Brady, female    DOB: 1985-09-05, 39 y.o.   MRN: [id number]  HPI: Sherri Brady is a 39 y.o. female  Chief Complaint  Patient presents with   Establish Care    Patient states she feels like she may have an ear infection because there is drainage from it and blood when she cleans it, but no pain.. Also, she has back spasms off and on once a month and she takes medicine to help but they still come and go. It's been on going for 4 years now. Patient stated she also wants to be referred to do a sleep study because sometimes she stops breathing in her sleep.   Hypertension    Patient states she believes she may have high BP. After her last child it was going up.   Patient presents to clinic to establish care with new PCP.  Introduced to publishing rights manager role and practice setting.  All questions answered.  Discussed provider/patient relationship and expectations.  Patient reports a history of htn after pregnancy. Former smoker- quit in 2024.     Patient denies a history of: Hypertension, Elevated Cholesterol, Diabetes, Thyroid problems, Depression, Anxiety, Neurological problems, and Abdominal problems.   Patient states she has noticed when she is sleeping she wakes herself up from not breathing.  She does snore.  Her boyfriend has witnessed her stopping breathing while sleeping.  She also notices her heart race at times when she is sleeping and it wakes her up.    Patient states she has been having some ear drainage for two days.  This has been ongoing x 1 year.    BACK PAIN Patient states that once a month or so she will get muscle pain.  Feels an achy/throbbing feeling.  Has to take ibuprofen  and take a nap.   Duration: years Mechanism  of injury: none Location: midline, bilateral, and low back Onset: gradual Severity: 10/10 Quality: aching and throbbing Frequency: intermittent Radiation: none Aggravating factors: none Alleviating factors: laying and NSAIDs Status: stable Treatments attempted: rest and ibuprofen   Relief with NSAIDs?: significant Nighttime pain:  no Paresthesias / decreased sensation:  no Bowel / bladder incontinence:  no Fevers:  no Dysuria / urinary frequency:  no   Active Ambulatory Problems    Diagnosis Date Noted   Encounter for care or examination of lactating mother 03/29/2020   Obesity 06/01/2020   Tobacco use 06/01/2020   Resolved Ambulatory Problems    Diagnosis Date Noted   Supervision of normal pregnancy, antepartum 06/02/2016   Abnormal glucose 06/02/2016   Obesity in pregnancy 06/02/2016   Low-lying anterior placenta in second trimester 06/13/2016   Normal labor 10/30/2016   Needle phobia 01/04/2012   Encounter for supervision of low-risk pregnancy, antepartum 09/27/2019   Obesity affecting pregnancy in third trimester 03/04/2020   Labor and delivery, indication for care 03/26/2020   Single liveborn infant delivered vaginally 03/29/2020   Postpartum care following vaginal delivery 03/29/2020   Past Medical History:  Diagnosis Date   Hypertension    Medical history non-contributory    Patient denies medical problems    Past Surgical History:  Procedure Laterality Date   DILATION AND CURETTAGE OF UTERUS     Family History  Problem Relation Age of Onset   Cataracts Mother    Arthritis Mother    Healthy Father    Seizures Half-Brother    Alcohol abuse Neg Hx    Asthma Neg Hx    Birth defects Neg Hx    Cancer Neg Hx    COPD Neg Hx    Depression Neg Hx    Diabetes Neg Hx    Drug abuse Neg Hx    Early death Neg Hx    Hearing loss Neg Hx    Heart disease Neg Hx    Hyperlipidemia Neg Hx    Hypertension Neg Hx    Kidney disease Neg Hx    Learning disabilities  Neg Hx    Mental illness Neg Hx    Mental retardation Neg Hx    Miscarriages / Stillbirths Neg Hx    Stroke Neg Hx    Vision loss Neg Hx    Varicose Veins Neg Hx      Review of Systems  HENT:  Positive for ear discharge.   Respiratory:  Positive for apnea.        Snoring  Musculoskeletal:  Positive for back pain.    Per HPI unless specifically indicated above     Objective:    BP 131/88 (BP Location: Right Arm, Cuff Size: Normal)   Pulse 66   Temp 98 F (36.7 C) (Oral)   Ht 5' 2.6 (1.59 m)   Wt 181 lb 3.2 oz (82.2 kg)   LMP 08/13/2024 (Exact Date)   SpO2 99%   BMI 32.51 kg/m   Wt Readings from Last 3 Encounters:  08/14/24 181 lb 3.2 oz (82.2 kg)  08/22/23 175 lb 3.2 oz (79.5 kg)  05/29/23 175 lb 8 oz (79.6 kg)    Physical Exam Vitals and nursing note reviewed.  Constitutional:      General: She is not in acute distress.    Appearance: Normal appearance. She is normal weight. She is not ill-appearing, toxic-appearing or diaphoretic.  HENT:     Head: Normocephalic.     Right Ear: External ear normal. Tympanic membrane is perforated and erythematous.     Left Ear: External ear normal. Tympanic membrane is not perforated or erythematous.     Nose: Nose normal.     Mouth/Throat:     Mouth: Mucous membranes are moist.     Pharynx: Oropharynx is clear.  Eyes:     General:        Right eye: No discharge.        Left eye: No discharge.     Extraocular Movements: Extraocular movements intact.     Conjunctiva/sclera: Conjunctivae normal.     Pupils: Pupils are equal, round, and reactive to light.  Cardiovascular:     Rate and Rhythm: Normal rate and regular rhythm.     Heart sounds: No murmur heard. Pulmonary:     Effort: Pulmonary effort is normal. No respiratory distress.     Breath sounds: Normal breath sounds. No wheezing or rales.  Musculoskeletal:     Cervical back: Normal range of motion and neck supple.  Skin:    General: Skin is warm and dry.      Capillary Refill: Capillary refill takes less than 2 seconds.  Neurological:     General: No focal deficit present.     Mental Status: She is alert and oriented to person,  place, and time. Mental status is at baseline.  Psychiatric:        Mood and Affect: Mood normal.        Behavior: Behavior normal.        Thought Content: Thought content normal.        Judgment: Judgment normal.     Results for orders placed or performed in visit on 05/04/21  Cytology - PAP   Collection Time: 05/04/21  2:03 PM  Result Value Ref Range   High risk HPV Negative    Adequacy      Satisfactory for evaluation; transformation zone component PRESENT.   Diagnosis      - Negative for intraepithelial lesion or malignancy (NILM)   Comment Normal Reference Range HPV - Negative       Assessment & Plan:   Problem List Items Addressed This Visit   None Visit Diagnoses       Apnea    -  Primary   Referral placed for sleep study.  Will evaluate for OSA.   Relevant Orders   Ambulatory referral to Sleep Studies     Perforation of right tympanic membrane       Ruptured TM on right side.  Referral placed for ENT. Unsure of when this occured. No recent illness and no pain.   Relevant Orders   Ambulatory referral to ENT     Chronic bilateral low back pain without sciatica       Ongoing concern for several years. Intermittent pain improved with NSAIDS and rest. Will send flexeril  PRN for pain and provide stretches to help prevent pain.   Relevant Medications   cyclobenzaprine  (FLEXERIL ) 5 MG tablet     Encounter to establish care            Follow up plan: Return in about 1 month (around 09/14/2024) for Physical and Fasting labs.      "

## 2024-08-16 ENCOUNTER — Other Ambulatory Visit: Payer: Self-pay

## 2024-08-16 DIAGNOSIS — Z3009 Encounter for other general counseling and advice on contraception: Secondary | ICD-10-CM

## 2024-08-16 MED ORDER — NORETHINDRONE 0.35 MG PO TABS: 1.0000 | ORAL_TABLET | Freq: Every day | ORAL | 3 refills | Status: AC

## 2024-08-16 NOTE — Progress Notes (Signed)
 Refill for 3 months of Micronor  sent to pharmacy. Patient is overdue for annual physical exam. Scheduling team will reach out to patient to schedule.  - norethindrone  (MICRONOR ) 0.35 MG tablet; Take 1 tablet (0.35 mg total) by mouth daily.  Dispense: 28 tablet; Refill: 3   Damien Satchel [facility]

## 2024-09-03 ENCOUNTER — Ambulatory Visit

## 2024-09-17 ENCOUNTER — Encounter: Admitting: Nurse Practitioner
